# Patient Record
Sex: Female | Born: 1950 | Race: Black or African American | Hispanic: No | Smoking: Former smoker
Health system: Southern US, Community
[De-identification: ages and names within clinical notes are randomized; demographics above are authoritative.]

## PROBLEM LIST (undated history)

## (undated) DIAGNOSIS — I10 Essential (primary) hypertension: Secondary | ICD-10-CM

## (undated) DIAGNOSIS — E213 Hyperparathyroidism, unspecified: Secondary | ICD-10-CM

## (undated) DIAGNOSIS — J45909 Unspecified asthma, uncomplicated: Secondary | ICD-10-CM

## (undated) DIAGNOSIS — K635 Polyp of colon: Secondary | ICD-10-CM

## (undated) DIAGNOSIS — D351 Benign neoplasm of parathyroid gland: Secondary | ICD-10-CM

## (undated) DIAGNOSIS — D369 Benign neoplasm, unspecified site: Secondary | ICD-10-CM

## (undated) HISTORY — DX: Polyp of colon: K63.5

## (undated) HISTORY — DX: Unspecified asthma, uncomplicated: J45.909

## (undated) HISTORY — DX: Hypercalcemia: E83.52

## (undated) HISTORY — PX: OTHER SURGICAL HISTORY: SHX169

## (undated) HISTORY — DX: Benign neoplasm of parathyroid gland: D35.1

## (undated) HISTORY — DX: Hyperparathyroidism, unspecified: E21.3

---

## 2004-02-14 ENCOUNTER — Ambulatory Visit (HOSPITAL_COMMUNITY): Admission: RE | Admit: 2004-02-14 | Discharge: 2004-02-14 | Payer: Self-pay | Admitting: Endocrinology

## 2007-05-19 ENCOUNTER — Ambulatory Visit (HOSPITAL_COMMUNITY): Admission: RE | Admit: 2007-05-19 | Discharge: 2007-05-19 | Payer: Self-pay | Admitting: Family Medicine

## 2009-03-04 ENCOUNTER — Ambulatory Visit (HOSPITAL_COMMUNITY): Admission: RE | Admit: 2009-03-04 | Discharge: 2009-03-04 | Payer: Self-pay | Admitting: Internal Medicine

## 2009-04-10 ENCOUNTER — Ambulatory Visit (HOSPITAL_COMMUNITY): Admission: RE | Admit: 2009-04-10 | Discharge: 2009-04-10 | Payer: Self-pay | Admitting: Internal Medicine

## 2010-03-06 ENCOUNTER — Ambulatory Visit (HOSPITAL_COMMUNITY): Admission: RE | Admit: 2010-03-06 | Discharge: 2010-03-06 | Payer: Self-pay | Admitting: Gastroenterology

## 2010-04-20 HISTORY — PX: COLONOSCOPY: SHX174

## 2011-12-01 ENCOUNTER — Other Ambulatory Visit (HOSPITAL_COMMUNITY): Payer: Self-pay | Admitting: *Deleted

## 2011-12-01 DIAGNOSIS — Z139 Encounter for screening, unspecified: Secondary | ICD-10-CM

## 2011-12-03 ENCOUNTER — Ambulatory Visit (HOSPITAL_COMMUNITY)
Admission: RE | Admit: 2011-12-03 | Discharge: 2011-12-03 | Disposition: A | Discharge status: Home / Self Care | Payer: PRIVATE HEALTH INSURANCE | Source: Ambulatory Visit | Attending: *Deleted | Admitting: *Deleted

## 2011-12-03 DIAGNOSIS — Z139 Encounter for screening, unspecified: Secondary | ICD-10-CM

## 2011-12-03 DIAGNOSIS — R922 Inconclusive mammogram: Secondary | ICD-10-CM | POA: Insufficient documentation

## 2011-12-03 DIAGNOSIS — Z1231 Encounter for screening mammogram for malignant neoplasm of breast: Secondary | ICD-10-CM | POA: Insufficient documentation

## 2011-12-07 ENCOUNTER — Other Ambulatory Visit: Payer: Self-pay | Admitting: *Deleted

## 2011-12-07 DIAGNOSIS — R928 Other abnormal and inconclusive findings on diagnostic imaging of breast: Secondary | ICD-10-CM

## 2011-12-16 ENCOUNTER — Other Ambulatory Visit (HOSPITAL_COMMUNITY): Payer: Self-pay | Admitting: *Deleted

## 2011-12-16 ENCOUNTER — Ambulatory Visit (HOSPITAL_COMMUNITY)
Admission: RE | Admit: 2011-12-16 | Discharge: 2011-12-16 | Disposition: A | Discharge status: Home / Self Care | Payer: PRIVATE HEALTH INSURANCE | Source: Ambulatory Visit | Attending: *Deleted | Admitting: *Deleted

## 2011-12-16 DIAGNOSIS — R928 Other abnormal and inconclusive findings on diagnostic imaging of breast: Secondary | ICD-10-CM

## 2011-12-16 DIAGNOSIS — N63 Unspecified lump in unspecified breast: Secondary | ICD-10-CM | POA: Insufficient documentation

## 2012-02-18 ENCOUNTER — Other Ambulatory Visit (HOSPITAL_COMMUNITY): Payer: Self-pay | Admitting: Nurse Practitioner

## 2012-02-18 DIAGNOSIS — Z09 Encounter for follow-up examination after completed treatment for conditions other than malignant neoplasm: Secondary | ICD-10-CM

## 2012-03-23 ENCOUNTER — Ambulatory Visit (HOSPITAL_COMMUNITY)
Admission: RE | Admit: 2012-03-23 | Discharge: 2012-03-23 | Disposition: A | Discharge status: Home / Self Care | Payer: PRIVATE HEALTH INSURANCE | Source: Ambulatory Visit | Attending: Nurse Practitioner | Admitting: Nurse Practitioner

## 2012-03-23 DIAGNOSIS — Z09 Encounter for follow-up examination after completed treatment for conditions other than malignant neoplasm: Secondary | ICD-10-CM | POA: Insufficient documentation

## 2012-08-24 ENCOUNTER — Other Ambulatory Visit: Payer: Self-pay | Admitting: Family Medicine

## 2012-08-24 DIAGNOSIS — Z09 Encounter for follow-up examination after completed treatment for conditions other than malignant neoplasm: Secondary | ICD-10-CM

## 2012-08-24 DIAGNOSIS — Z139 Encounter for screening, unspecified: Secondary | ICD-10-CM

## 2012-09-28 ENCOUNTER — Telehealth: Payer: Self-pay | Admitting: Family Medicine

## 2012-09-28 ENCOUNTER — Other Ambulatory Visit (HOSPITAL_COMMUNITY): Payer: Self-pay | Admitting: Family Medicine

## 2012-09-28 ENCOUNTER — Ambulatory Visit (HOSPITAL_COMMUNITY)
Admission: RE | Admit: 2012-09-28 | Discharge: 2012-09-28 | Disposition: A | Discharge status: Home / Self Care | Payer: BC Managed Care – PPO | Source: Ambulatory Visit | Attending: Family Medicine | Admitting: Family Medicine

## 2012-09-28 ENCOUNTER — Other Ambulatory Visit: Payer: Self-pay | Admitting: Family Medicine

## 2012-09-28 DIAGNOSIS — N649 Disorder of breast, unspecified: Secondary | ICD-10-CM | POA: Insufficient documentation

## 2012-09-28 DIAGNOSIS — Z09 Encounter for follow-up examination after completed treatment for conditions other than malignant neoplasm: Secondary | ICD-10-CM

## 2012-09-29 NOTE — Telephone Encounter (Signed)
Per Dr Modesto Charon he "maxed out and prefers to be seen by another provider   Left message for her to return call

## 2012-09-29 NOTE — Telephone Encounter (Signed)
fyi

## 2012-09-30 NOTE — Telephone Encounter (Signed)
Pt notified and informed other providers that would be glad to take her as new pt

## 2012-11-03 ENCOUNTER — Other Ambulatory Visit: Payer: Self-pay | Admitting: Family Medicine

## 2012-11-03 DIAGNOSIS — Z09 Encounter for follow-up examination after completed treatment for conditions other than malignant neoplasm: Secondary | ICD-10-CM

## 2012-12-07 ENCOUNTER — Other Ambulatory Visit (HOSPITAL_COMMUNITY): Payer: Self-pay | Admitting: Family Medicine

## 2012-12-07 ENCOUNTER — Ambulatory Visit (HOSPITAL_COMMUNITY)
Admission: RE | Admit: 2012-12-07 | Discharge: 2012-12-07 | Disposition: A | Discharge status: Home / Self Care | Payer: BC Managed Care – PPO | Source: Ambulatory Visit | Attending: Family Medicine | Admitting: Family Medicine

## 2012-12-07 DIAGNOSIS — Z09 Encounter for follow-up examination after completed treatment for conditions other than malignant neoplasm: Secondary | ICD-10-CM

## 2012-12-07 DIAGNOSIS — Z1231 Encounter for screening mammogram for malignant neoplasm of breast: Secondary | ICD-10-CM | POA: Insufficient documentation

## 2012-12-12 ENCOUNTER — Other Ambulatory Visit: Payer: Self-pay | Admitting: Nurse Practitioner

## 2013-01-24 ENCOUNTER — Ambulatory Visit (INDEPENDENT_AMBULATORY_CARE_PROVIDER_SITE_OTHER): Payer: BC Managed Care – PPO | Admitting: Nurse Practitioner

## 2013-01-24 ENCOUNTER — Encounter: Payer: Self-pay | Admitting: Nurse Practitioner

## 2013-01-24 VITALS — BP 146/75 | HR 65 | Temp 98.7°F | Ht 63.0 in | Wt 120.0 lb

## 2013-01-24 DIAGNOSIS — Z01419 Encounter for gynecological examination (general) (routine) without abnormal findings: Secondary | ICD-10-CM

## 2013-01-24 DIAGNOSIS — Z0000 Encounter for general adult medical examination without abnormal findings: Secondary | ICD-10-CM

## 2013-01-24 DIAGNOSIS — Z124 Encounter for screening for malignant neoplasm of cervix: Secondary | ICD-10-CM

## 2013-01-24 LAB — POCT CBC
Granulocyte percent: 49.6 %G (ref 37–80)
HCT, POC: 41.6 % (ref 37.7–47.9)
Hemoglobin: 13.5 g/dL (ref 12.2–16.2)
Lymph, poc: 1.8 (ref 0.6–3.4)
MCH, POC: 29.5 pg (ref 27–31.2)
MCHC: 32.5 g/dL (ref 31.8–35.4)
MCV: 90.8 fL (ref 80–97)
MPV: 7.4 fL (ref 0–99.8)
POC Granulocyte: 2.1 (ref 2–6.9)
POC LYMPH PERCENT: 42.4 %L (ref 10–50)
Platelet Count, POC: 262 10*3/uL (ref 142–424)
RBC: 4.6 M/uL (ref 4.04–5.48)
RDW, POC: 13 %
WBC: 4.3 10*3/uL — AB (ref 4.6–10.2)

## 2013-01-24 LAB — POCT URINALYSIS DIPSTICK
Bilirubin, UA: NEGATIVE
Glucose, UA: NEGATIVE
Ketones, UA: NEGATIVE
Leukocytes, UA: NEGATIVE
Nitrite, UA: NEGATIVE
Spec Grav, UA: 1.02
Urobilinogen, UA: NEGATIVE
pH, UA: 6.5

## 2013-01-24 NOTE — Patient Instructions (Addendum)

## 2013-01-24 NOTE — Progress Notes (Signed)
  Subjective:    Patient ID: Kimberly Aguilar, female    DOB: July 10, 1950, 62 y.o.   MRN: [id number]  HPI Pt here for PE and pap. Pt denies any medical problems, concerns, or complaints at this time.    Review of Systems  All other systems reviewed and are negative.       Objective:   Physical Exam  Vitals reviewed. Constitutional: She is oriented to person, place, and time. She appears well-developed and well-nourished.  HENT:  Head: Normocephalic.  Right Ear: Hearing, tympanic membrane, external ear and ear canal normal.  Left Ear: Hearing, tympanic membrane, external ear and ear canal normal.  Nose: Nose normal.  Mouth/Throat: Uvula is midline and oropharynx is clear and moist.  Eyes: Conjunctivae and EOM are normal. Pupils are equal, round, and reactive to light.  Neck: Normal range of motion and full passive range of motion without pain. Neck supple. No JVD present. Carotid bruit is not present. No mass and no thyromegaly present.  Cardiovascular: Normal rate, regular rhythm, normal heart sounds and intact distal pulses.   No murmur heard. Pulmonary/Chest: Effort normal and breath sounds normal. No respiratory distress. She has no wheezes. Right breast exhibits no inverted nipple, no mass, no nipple discharge, no skin change and no tenderness. Left breast exhibits no inverted nipple, no mass, no nipple discharge, no skin change and no tenderness.  Abdominal: Soft. Bowel sounds are normal. She exhibits no distension and no mass. There is no tenderness.  Genitourinary: Vagina normal and uterus normal. No breast swelling, tenderness, discharge or bleeding.  bimanual exam-No adnexal masses or tenderness. Cervix parous and pink  Musculoskeletal: Normal range of motion.  Lymphadenopathy:    She has no cervical adenopathy.  Neurological: She is alert and oriented to person, place, and time. She has normal reflexes.  Skin: Skin is warm and dry.  Psychiatric: She has a normal mood and  affect. Her behavior is normal. Judgment and thought content normal.     BP 146/75  Pulse 65  Temp(Src) 98.7 F (37.1 C) (Oral)  Ht 5\' 3"  (1.6 m)  Wt 120 lb (54.432 kg)  BMI 21.26 kg/m2       Assessment & Plan:   1. Annual physical exam   2. Encounter for routine gynecological examination    Orders Placed This Encounter  Procedures  . CMP14+EGFR  . NMR, lipoprofile  . Thyroid Panel With TSH  . Vit D  25 hydroxy (rtn osteoporosis monitoring)  . Urinalysis Dipstick  . POCT CBC   Referral made for colonoscopy Continue all meds Labs pending Diet and exercise encouraged Health maintenance reviewed Follow up in 1 year  Mary-Margaret Daphine Deutscher, FNP

## 2013-01-26 LAB — CMP14+EGFR
ALT: 13 IU/L (ref 0–32)
AST: 14 IU/L (ref 0–40)
Albumin/Globulin Ratio: 1.8 (ref 1.1–2.5)
Albumin: 4.6 g/dL (ref 3.6–4.8)
Alkaline Phosphatase: 76 IU/L (ref 39–117)
BUN/Creatinine Ratio: 14 (ref 11–26)
BUN: 13 mg/dL (ref 8–27)
CO2: 27 mmol/L (ref 18–29)
Calcium: 10.6 mg/dL — ABNORMAL HIGH (ref 8.6–10.2)
Chloride: 102 mmol/L (ref 97–108)
Creatinine, Ser: 0.94 mg/dL (ref 0.57–1.00)
GFR calc Af Amer: 76 mL/min/{1.73_m2} (ref >59)
GFR calc non Af Amer: 66 mL/min/{1.73_m2} (ref >59)
Globulin, Total: 2.5 g/dL (ref 1.5–4.5)
Glucose: 92 mg/dL (ref 65–99)
Potassium: 4.4 mmol/L (ref 3.5–5.2)
Sodium: 142 mmol/L (ref 134–144)
Total Bilirubin: 0.4 mg/dL (ref 0.0–1.2)
Total Protein: 7.1 g/dL (ref 6.0–8.5)

## 2013-01-26 LAB — NMR, LIPOPROFILE
Cholesterol: 220 mg/dL — ABNORMAL HIGH (ref <200)
HDL Cholesterol by NMR: 93 mg/dL (ref >=40)
HDL Particle Number: 39.2 umol/L (ref >=30.5)
LDL Particle Number: 917 nmol/L (ref <1000)
LDL Size: 21.2 nm (ref >20.5)
LDLC SERPL CALC-MCNC: 113 mg/dL — ABNORMAL HIGH (ref <100)
LP-IR Score: <25 (ref <=45)
Small LDL Particle Number: <90 nmol/L (ref <=527)
Triglycerides by NMR: 69 mg/dL (ref <150)

## 2013-01-26 LAB — VITAMIN D 25 HYDROXY (VIT D DEFICIENCY, FRACTURES): Vit D, 25-Hydroxy: 38.1 ng/mL (ref 30.0–100.0)

## 2013-01-26 LAB — THYROID PANEL WITH TSH
Free Thyroxine Index: 1.7 (ref 1.2–4.9)
T3 Uptake Ratio: 27 % (ref 24–39)
T4, Total: 6.2 ug/dL (ref 4.5–12.0)
TSH: 4.71 u[IU]/mL — ABNORMAL HIGH (ref 0.450–4.500)

## 2013-01-28 LAB — PAP IG W/ RFLX HPV ASCU: PAP Smear Comment: 0

## 2013-02-09 ENCOUNTER — Encounter: Payer: Self-pay | Admitting: *Deleted

## 2013-02-09 NOTE — Progress Notes (Signed)
Quick Note:  Copy of labs sent to patient ______ 

## 2013-03-06 ENCOUNTER — Encounter: Payer: Self-pay | Admitting: Internal Medicine

## 2013-03-31 ENCOUNTER — Ambulatory Visit (INDEPENDENT_AMBULATORY_CARE_PROVIDER_SITE_OTHER): Payer: BC Managed Care – PPO | Admitting: Family Medicine

## 2013-03-31 ENCOUNTER — Encounter: Payer: Self-pay | Admitting: Family Medicine

## 2013-03-31 DIAGNOSIS — R7989 Other specified abnormal findings of blood chemistry: Secondary | ICD-10-CM | POA: Insufficient documentation

## 2013-03-31 DIAGNOSIS — L219 Seborrheic dermatitis, unspecified: Secondary | ICD-10-CM | POA: Insufficient documentation

## 2013-03-31 DIAGNOSIS — L218 Other seborrheic dermatitis: Secondary | ICD-10-CM

## 2013-03-31 DIAGNOSIS — R6889 Other general symptoms and signs: Secondary | ICD-10-CM

## 2013-03-31 NOTE — Progress Notes (Signed)
Patient ID: Kimberly Aguilar, female   DOB: 05/28/1950, 62 y.o.   MRN: [id number] SUBJECTIVE: CC: Chief Complaint  Patient presents with  . Hypothyroidism    TSH slightly elevated at visit 2 months ago. She was asked to rpt TSH. She was not started on any medications. Patient does complain of fatigue but no other symptoms of hypothyroidism.   Marland Kitchen Hypercalcemia    HPI: Had a physical exam recently. See EPIC note. The TSH was abnormal. Saw Dr Sherril Croon  Many years ago and was found to have hypercalcemia.had work up. Was on a medication in the past then later diet low in calcium. Fatigue and scalp rashes.   Past Medical History  Diagnosis Date  . Hypercalcemia    Past Surgical History  Procedure Laterality Date  . Cyst removal from left breast     History   Social History  . Marital Status: Widowed    Spouse Name: N/A    Number of Children: N/A  . Years of Education: N/A   Occupational History  . Not on file.   Social History Main Topics  . Smoking status: Former Smoker    Quit date: 03/31/1973  . Smokeless tobacco: Not on file  . Alcohol Use: No  . Drug Use: No  . Sexual Activity: Not on file   Other Topics Concern  . Not on file   Social History Narrative  . No narrative on file   Family History  Problem Relation Age of Onset  . Hypertension Mother   . Hypertension Father   . Congestive Heart Failure Brother   . Seizures Brother    No current outpatient prescriptions on file prior to visit.   No current facility-administered medications on file prior to visit.   No Known Allergies  There is no immunization history on file for this patient. Prior to Admission medications   Not on File     ROS: As above in the HPI. All other systems are stable or negative.  OBJECTIVE: APPEARANCE:  Patient in no acute distress.The patient appeared well nourished and normally developed. Acyanotic. Waist: VITAL SIGNS:BP 159/81  Pulse 70  Temp(Src) 99.4 F (37.4 C) (Oral)   Ht 5\' 3"  (1.6 m)  Wt 119 lb (53.978 kg)  BMI 21.09 kg/m2  Recheck BP 142/78  AAF  SKIN: warm and  Dry without , tattoos and scars  Left posterior scalp itchy and flakes.  HEAD and Neck: without JVD, Head and scalp: normal Eyes:No scleral icterus. Fundi normal, eye movements normal. Ears: Auricle normal, canal normal, Tympanic membranes normal, insufflation normal. Nose: normal Throat: normal Neck & thyroid: normal  CHEST & LUNGS: Chest wall: normal Lungs: Clear  CVS: Reveals the PMI to be normally located. Regular rhythm, First and Second Heart sounds are normal,  absence of murmurs, rubs or gallops. Peripheral vasculature: Radial pulses: normal Dorsal pedis pulses: normal Posterior pulses: normal  ABDOMEN:  Appearance: normal Benign, no organomegaly, no masses, no Abdominal Aortic enlargement. No Guarding , no rebound. No Bruits. Bowel sounds: normal  RECTAL: N/A GU: N/A  EXTREMETIES: nonedematous.  MUSCULOSKELETAL:  Spine: normal Joints: intact  NEUROLOGIC: oriented to time,place and person; nonfocal. Strength is normal Sensory is normal Reflexes are normal Cranial Nerves are normal.   Results for orders placed in visit on 01/24/13  CMP14+EGFR      Result Value Range   Glucose 92  65 - 99 mg/dL   BUN 13  8 - 27 mg/dL   Creatinine, Ser 4.09  0.57 - 1.00 mg/dL   GFR calc non Af Amer 66  >59 mL/min/1.73   GFR calc Af Amer 76  >59 mL/min/1.73   BUN/Creatinine Ratio 14  11 - 26   Sodium 142  134 - 144 mmol/L   Potassium 4.4  3.5 - 5.2 mmol/L   Chloride 102  97 - 108 mmol/L   CO2 27  18 - 29 mmol/L   Calcium 10.6 (*) 8.6 - 10.2 mg/dL   Total Protein 7.1  6.0 - 8.5 g/dL   Albumin 4.6  3.6 - 4.8 g/dL   Globulin, Total 2.5  1.5 - 4.5 g/dL   Albumin/Globulin Ratio 1.8  1.1 - 2.5   Total Bilirubin 0.4  0.0 - 1.2 mg/dL   Alkaline Phosphatase 76  39 - 117 IU/L   AST 14  0 - 40 IU/L   ALT 13  0 - 32 IU/L  NMR, LIPOPROFILE      Result Value Range    LDL Particle Number 917  <1000 nmol/L   LDLC SERPL CALC-MCNC 113 (*) <100 mg/dL   HDL Cholesterol by NMR 93  >=40 mg/dL   Triglycerides by NMR 69  <150 mg/dL   Cholesterol 960 (*) <454 mg/dL   HDL Particle Number 09.8  >=11.9 umol/L   Small LDL Particle Number < 90  <= 527 nmol/L   LDL Size 21.2  >20.5 nm   LP-IR Score < 25  <= 45  THYROID PANEL WITH TSH      Result Value Range   TSH 4.710 (*) 0.450 - 4.500 uIU/mL   COMMENT Comment     T4, Total 6.2  4.5 - 12.0 ug/dL   T3 Uptake Ratio 27  24 - 39 %   Free Thyroxine Index 1.7  1.2 - 4.9  VITAMIN D 25 HYDROXY      Result Value Range   Vit D, 25-Hydroxy 38.1  30.0 - 100.0 ng/mL  POCT URINALYSIS DIPSTICK      Result Value Range   Color, UA yellow     Clarity, UA clear     Glucose, UA neg     Bilirubin, UA neg     Ketones, UA neg     Spec Grav, UA 1.020     Blood, UA trace     pH, UA 6.5     Protein, UA small     Urobilinogen, UA negative     Nitrite, UA neg     Leukocytes, UA Negative    POCT CBC      Result Value Range   WBC 4.3 (*) 4.6 - 10.2 K/uL   Lymph, poc 1.8  0.6 - 3.4   POC LYMPH PERCENT 42.4  10 - 50 %L   POC Granulocyte 2.1  2 - 6.9   Granulocyte percent 49.6  37 - 80 %G   RBC 4.6  4.04 - 5.48 M/uL   Hemoglobin 13.5  12.2 - 16.2 g/dL   HCT, POC 14.7  82.9 - 47.9 %   MCV 90.8  80 - 97 fL   MCH, POC 29.5  27 - 31.2 pg   MCHC 32.5  31.8 - 35.4 g/dL   RDW, POC 56.2     Platelet Count, POC 262.0  142 - 424 K/uL   MPV 7.4  0 - 99.8 fL  PAP IG W/ RFLX HPV ASCU      Result Value Range   DIAGNOSIS: Comment     Specimen adequacy: Comment  PATH REPORT.FINAL DX SPEC Comment     Performed by: Comment     QC reviewed by: Comment     PAP SMEAR COMMENT .     Note: Comment     Test Methodology Comment     PAP REFLEX: Comment      ASSESSMENT: Hypercalcemia - Plan: PTH, Intact and Calcium, Calcium, ionized  Abnormal TSH - Plan: Thyroid Panel With TSH  Seborrheic eczema of scalp  PLAN: Discussed the  abnormal labs.  Orders Placed This Encounter  Procedures  . PTH, Intact and Calcium  . Thyroid Panel With TSH  . Calcium, ionized  await results. Reviewed her record in EPIC.  No orders of the defined types were placed in this encounter.   There are no discontinued medications. Return if symptoms worsen or fail to improve.  Kebron Pulse P. Modesto Charon, M.D.

## 2013-04-05 LAB — THYROID PANEL WITH TSH
Free Thyroxine Index: 1.5 (ref 1.2–4.9)
T3 Uptake Ratio: 29 % (ref 24–39)
T4, Total: 5.2 ug/dL (ref 4.5–12.0)
TSH: 3.75 u[IU]/mL (ref 0.450–4.500)

## 2013-04-05 LAB — PTH, INTACT AND CALCIUM
Calcium: 10.9 mg/dL — ABNORMAL HIGH (ref 8.6–10.2)
PTH: 91 pg/mL — ABNORMAL HIGH (ref 15–65)

## 2013-04-05 LAB — SPECIMEN STATUS REPORT

## 2013-04-05 LAB — CALCIUM, IONIZED: Calcium, Ion: 5.7 mg/dL — ABNORMAL HIGH (ref 4.5–5.6)

## 2013-04-06 ENCOUNTER — Other Ambulatory Visit (INDEPENDENT_AMBULATORY_CARE_PROVIDER_SITE_OTHER): Payer: BC Managed Care – PPO | Admitting: Family Medicine

## 2013-04-06 DIAGNOSIS — E213 Hyperparathyroidism, unspecified: Secondary | ICD-10-CM

## 2013-04-06 NOTE — Progress Notes (Signed)
Quick Note:  Call Patient Labs that are abnormal: Calcium and PTH are high. 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 We will need to delineate which part.   The thyroid panel is fine. Does not need thyroid hormones  Recommendations: Sestamibi scan Ordered in Epic   ______

## 2013-04-18 ENCOUNTER — Telehealth: Payer: Self-pay | Admitting: Family Medicine

## 2013-04-18 NOTE — Telephone Encounter (Signed)
Tried calling on labs no numbers working

## 2013-04-18 NOTE — Telephone Encounter (Signed)
Message copied by Azalee Course on Tue Apr 18, 2013  3:01 PM ------      Message from: Ileana Ladd      Created: Thu Apr 06, 2013 10:38 PM       Call Patient      Labs that are abnormal:      Calcium and PTH are high. 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 We will need to delineate which part.             The thyroid panel is fine. Does not need thyroid hormones            Recommendations:      Sestamibi scan      Ordered in Epic             ------

## 2013-04-20 DIAGNOSIS — D351 Benign neoplasm of parathyroid gland: Secondary | ICD-10-CM

## 2013-04-20 HISTORY — DX: Benign neoplasm of parathyroid gland: D35.1

## 2013-05-02 ENCOUNTER — Encounter: Payer: Self-pay | Admitting: Internal Medicine

## 2013-05-02 ENCOUNTER — Ambulatory Visit (AMBULATORY_SURGERY_CENTER): Payer: Self-pay

## 2013-05-02 VITALS — Ht 62.0 in | Wt 119.8 lb

## 2013-05-02 DIAGNOSIS — Z1211 Encounter for screening for malignant neoplasm of colon: Secondary | ICD-10-CM

## 2013-05-02 MED ORDER — MOVIPREP 100 G PO SOLR: ORAL | Status: DC

## 2013-05-08 ENCOUNTER — Encounter: Payer: Self-pay | Admitting: *Deleted

## 2013-05-15 ENCOUNTER — Telehealth: Payer: Self-pay | Admitting: Family Medicine

## 2013-05-15 ENCOUNTER — Telehealth: Payer: Self-pay | Admitting: Internal Medicine

## 2013-05-15 NOTE — Telephone Encounter (Signed)
Reviewed Ca 110.6 and PTH elevated, will need further eval by Dr Jacelyn Grip but there is no acute concern. Lab values will not interfere with colonoscopy prep or with sedation

## 2013-05-15 NOTE — Telephone Encounter (Signed)
PT NOTIFED AND LAB RESULTS GIVEN

## 2013-05-16 NOTE — Telephone Encounter (Signed)
Called pt, no answer, left message for her to call

## 2013-05-17 ENCOUNTER — Encounter: Payer: Self-pay | Admitting: Internal Medicine

## 2013-05-17 ENCOUNTER — Ambulatory Visit (AMBULATORY_SURGERY_CENTER): Payer: BC Managed Care – PPO | Admitting: Internal Medicine

## 2013-05-17 VITALS — BP 135/79 | HR 52 | Temp 98.0°F | Resp 19 | Ht 62.0 in | Wt 119.0 lb

## 2013-05-17 DIAGNOSIS — D126 Benign neoplasm of colon, unspecified: Secondary | ICD-10-CM

## 2013-05-17 DIAGNOSIS — Z1211 Encounter for screening for malignant neoplasm of colon: Secondary | ICD-10-CM

## 2013-05-17 MED ORDER — SODIUM CHLORIDE 0.9 % IV SOLN: 500.0000 mL | INTRAVENOUS | Status: DC

## 2013-05-17 NOTE — Patient Instructions (Signed)
YOU HAD AN ENDOSCOPIC PROCEDURE TODAY AT THE [facility]: Refer to the procedure report that was given to you for any specific questions about what was found during the examination.  If the procedure report does not answer your questions, please call your gastroenterologist to clarify.  If you requested that your care partner not be given the details of your procedure findings, then the procedure report has been included in a sealed envelope for you to review at your convenience later.  YOU SHOULD EXPECT: Some feelings of bloating in the abdomen. Passage of more gas than usual.  Walking can help get rid of the air that was put into your GI tract during the procedure and reduce the bloating. If you had a lower endoscopy (such as a colonoscopy or flexible sigmoidoscopy) you may notice spotting of blood in your stool or on the toilet paper. If you underwent a bowel prep for your procedure, then you may not have a normal bowel movement for a few days.  DIET: Your first meal following the procedure should be a light meal and then it is ok to progress to your normal diet.  A half-sandwich or bowl of soup is an example of a good first meal.  Heavy or fried foods are harder to digest and may make you feel nauseous or bloated.  Likewise meals heavy in dairy and vegetables can cause extra gas to form and this can also increase the bloating.  Drink plenty of fluids but you should avoid alcoholic beverages for 24 hours.  ACTIVITY: Your care partner should take you home directly after the procedure.  You should plan to take it easy, moving slowly for the rest of the day.  You can resume normal activity the day after the procedure however you should NOT DRIVE or use heavy machinery for 24 hours (because of the sedation medicines used during the test).    SYMPTOMS TO REPORT IMMEDIATELY: A gastroenterologist can be reached at any hour.  During normal business hours, 8:30 AM to 5:00 PM Monday through Friday,  call [phone].  After hours and on weekends, please call the GI answering service at [phone] who will take a message and have the physician on call contact you.   Following lower endoscopy (colonoscopy or flexible sigmoidoscopy):  Excessive amounts of blood in the stool  Significant tenderness or worsening of abdominal pains  Swelling of the abdomen that is new, acute  Fever of 100F or higher    FOLLOW UP: If any biopsies were taken you will be contacted by phone or by letter within the next 1-3 weeks.  Call your gastroenterologist if you have not heard about the biopsies in 3 weeks.  Our staff will call the home number listed on your records the next business day following your procedure to check on you and address any questions or concerns that you may have at that time regarding the information given to you following your procedure. This is a courtesy call and so if there is no answer at the home number and we have not heard from you through the emergency physician on call, we will assume that you have returned to your regular daily activities without incident.  SIGNATURES/CONFIDENTIALITY: You and/or your care partner have signed paperwork which will be entered into your electronic medical record.  These signatures attest to the fact that that the information above on your After Visit Summary has been reviewed and is understood.  Full responsibility of the confidentiality   of this discharge information lies with you and/or your care-partner.  Polyp and diverticulosis information given.  Await pathology results.

## 2013-05-17 NOTE — Telephone Encounter (Signed)
LMOM that ok to proceed with procedure

## 2013-05-17 NOTE — Op Note (Signed)
[place]  [address]. [place], [address]   COLONOSCOPY PROCEDURE REPORT  PATIENT: Kimberly Aguilar, Kimberly Aguilar  MR#: [id number] BIRTHDATE: 21-Jun-1950 , 35  yrs. old GENDER: Female ENDOSCOPIST: Lafayette Dragon, MD REFERRED YD:XAJO Rockne Coons, N.P. PROCEDURE DATE:  05/17/2013 PROCEDURE:   Colonoscopy with snare polypectomy First Screening Colonoscopy - Avg.  risk and is 50 yrs.  old or older Yes.  Prior Negative Screening - Now for repeat screening. N/A  History of Adenoma - Now for follow-up colonoscopy & has been > or = to 3 yrs.  N/A  Polyps Removed Today? Yes. ASA CLASS:   Class II INDICATIONS:Average risk patient for colon cancer. MEDICATIONS: MAC sedation, administered by CRNA and Propofol (Diprivan) 480 mg IV  DESCRIPTION OF PROCEDURE:   After the risks benefits and alternatives of the procedure were thoroughly explained, informed consent was obtained.  A digital rectal exam revealed no abnormalities of the rectum.   The LB [id number]  endoscope was introduced through the anus and advanced to the cecum, which was identified by both the appendix and ileocecal valve. No adverse events experienced.   The quality of the prep was good, using MoviPrep  The instrument was then slowly withdrawn as the colon was fully examined.      COLON FINDINGS: A fungating semi-pedunculated  soft polyp measuring 15 mm in size was found in the ascending colon.at 60 cm  A polypectomy was performed piecemeal, using snare cautery.  The resection was complete and the polyp tissue was completely retrieved.  Retroflexed views revealed no abnormalities. The time to cecum=4 minutes 33 seconds.  Withdrawal time=34 minutes 17 seconds.  The scope was withdrawn and the procedure completed. COMPLICATIONS: There were no complications.  ENDOSCOPIC IMPRESSION: Semi-pedunculated polyp measuring 15 mm in size was found in the ascending colon at 60cm ( short scope) polypectomy was  performed using snare cautery , polyp removed piecemeal  RECOMMENDATIONS: 1.  Await pathology results 2.  recall colonoscopy pending path report High fiber diet   eSigned:  Lafayette Dragon, MD 05/17/2013 3:29 PM   cc:   PATIENT NAME:  [name], [name] MR#: [id number]

## 2013-05-17 NOTE — Progress Notes (Signed)
Pt stable to RR 

## 2013-05-17 NOTE — Progress Notes (Signed)
Called to room to assist during endoscopic procedure.  Patient ID and intended procedure confirmed with present staff. Received instructions for my participation in the procedure from the performing physician.  

## 2013-05-18 ENCOUNTER — Telehealth: Payer: Self-pay | Admitting: *Deleted

## 2013-05-18 NOTE — Telephone Encounter (Signed)
  Follow up Call-  Call back number 05/17/2013  Post procedure Call Back phone  # [phone]  Permission to leave phone message Yes     Patient questions:  Do you have a fever, pain , or abdominal swelling? no Pain Score  0 *  Have you tolerated food without any problems? yes  Have you been able to return to your normal activities? yes  Do you have any questions about your discharge instructions: Diet   no Medications  no Follow up visit  no  Do you have questions or concerns about your Care? no  Actions: * If pain score is 4 or above: No action needed, pain <4.

## 2013-05-23 ENCOUNTER — Encounter: Payer: Self-pay | Admitting: Internal Medicine

## 2013-05-24 ENCOUNTER — Encounter: Payer: Self-pay | Admitting: *Deleted

## 2013-06-01 ENCOUNTER — Telehealth: Payer: Self-pay | Admitting: Family Medicine

## 2013-06-03 NOTE — Telephone Encounter (Signed)
Call patient, she needs an appointment to discuss her results face to face .  she did not have a functioning telephone number and a letter was sent. It has been 2 months. She needs a nuclear scan and most likely needs surgery for her abnormal problem. Therefore since she is so difficult to get in touch with her she needs to come in for me to explain to her her problem and get the referrals done while she is here. Thanks. Lory Galan P. Jacelyn Grip, M.D.

## 2013-06-05 NOTE — Telephone Encounter (Signed)
Spoke with pt and appt given for feb 23 with dr Jacelyn Grip

## 2013-06-12 ENCOUNTER — Ambulatory Visit (INDEPENDENT_AMBULATORY_CARE_PROVIDER_SITE_OTHER): Payer: BC Managed Care – PPO | Admitting: Family Medicine

## 2013-06-12 ENCOUNTER — Encounter: Payer: Self-pay | Admitting: Family Medicine

## 2013-06-12 VITALS — BP 151/71 | HR 55 | Temp 98.4°F | Ht 63.0 in | Wt 119.2 lb

## 2013-06-12 DIAGNOSIS — E213 Hyperparathyroidism, unspecified: Secondary | ICD-10-CM

## 2013-06-12 DIAGNOSIS — Z860100 Personal history of colon polyps, unspecified: Secondary | ICD-10-CM | POA: Insufficient documentation

## 2013-06-12 DIAGNOSIS — I10 Essential (primary) hypertension: Secondary | ICD-10-CM

## 2013-06-12 DIAGNOSIS — K635 Polyp of colon: Secondary | ICD-10-CM | POA: Insufficient documentation

## 2013-06-12 MED ORDER — AMLODIPINE BESYLATE 2.5 MG PO TABS: 2.5000 mg | ORAL_TABLET | Freq: Every day | ORAL | Status: DC

## 2013-06-12 NOTE — Progress Notes (Signed)
Patient ID: Kimberly Aguilar, female   DOB: 09-12-1950, 63 y.o.   MRN: [id number] SUBJECTIVE: CC: Chief Complaint  Patient presents with  . Follow-up    2 month follow up  discuss labs     HPI: Here for follow up. Thyroid panel was normal. PTH elevated Calcium elevated.  Had colonoscopy. Wanted to ask about that.  Healthy diet and exercise. Does zumba  BP elevated. Asymptomatic. FH of HTN both parents.    Past Medical History  Diagnosis Date  . Hypercalcemia   . Colon polyp    Past Surgical History  Procedure Laterality Date  . Cyst removal from left breast     History   Social History  . Marital Status: Widowed    Spouse Name: N/A    Number of Children: N/A  . Years of Education: N/A   Occupational History  . Not on file.   Social History Main Topics  . Smoking status: Former Smoker    Types: Cigarettes    Quit date: 03/31/1973  . Smokeless tobacco: Never Used  . Alcohol Use: No  . Drug Use: No  . Sexual Activity: Not on file   Other Topics Concern  . Not on file   Social History Narrative  . No narrative on file   Family History  Problem Relation Age of Onset  . Hypertension Mother   . Hypertension Father   . Congestive Heart Failure Brother   . Heart disease Brother   . Seizures Brother   . Heart disease Brother   . Colon cancer Neg Hx    Current Outpatient Prescriptions on File Prior to Visit  Medication Sig Dispense Refill  . acetaminophen (TYLENOL) 500 MG tablet Take 500 mg by mouth as needed.      . Flaxseed, Linseed, (BL FLAX SEED OIL PO) Take by mouth. Take 1 tsp daily       No current facility-administered medications on file prior to visit.   No Known Allergies  There is no immunization history on file for this patient. Prior to Admission medications   Medication Sig Start Date End Date Taking? Authorizing Provider  acetaminophen (TYLENOL) 500 MG tablet Take 500 mg by mouth as needed.   Yes Historical Provider, MD  Flaxseed,  Linseed, (BL FLAX SEED OIL PO) Take by mouth. Take 1 tsp daily   Yes Historical Provider, MD     ROS: As above in the HPI. All other systems are stable or negative.  OBJECTIVE: APPEARANCE:  Patient in no acute distress.The patient appeared well nourished and normally developed. Acyanotic. Waist: VITAL SIGNS:BP 151/71  Pulse 55  Temp(Src) 98.4 F (36.9 C) (Oral)  Ht 5\' 3"  (1.6 m)  Wt 119 lb 3.2 oz (54.069 kg)  BMI 21.12 kg/m2  Slim AAF  SKIN: warm and  Dry without overt rashes, tattoos and scars  HEAD and Neck: without JVD, Head and scalp: normal Eyes:No scleral icterus. Fundi normal, eye movements normal. Ears: Auricle normal, canal normal, Tympanic membranes normal, insufflation normal. Nose: normal Throat: normal Neck & thyroid: normal  CHEST & LUNGS: Chest wall: normal Lungs: Clear  CVS: Reveals the PMI to be normally located. Regular rhythm, First and Second Heart sounds are normal,  absence of murmurs, rubs or gallops. Peripheral vasculature: Radial pulses: normal Dorsal pedis pulses: normal Posterior pulses: normal  ABDOMEN:  Appearance: normal Benign, no organomegaly, no masses, no Abdominal Aortic enlargement. No Guarding , no rebound. No Bruits. Bowel sounds: normal  RECTAL: N/A GU: N/A  EXTREMETIES: nonedematous.  MUSCULOSKELETAL:  Spine: normal Joints: intact  NEUROLOGIC: oriented to time,place and person; nonfocal. Strength is normal Sensory is normal Reflexes are normal Cranial Nerves are normal.  Results for orders placed in visit on 03/31/13  PTH, INTACT AND CALCIUM      Result Value Ref Range   Calcium 10.9 (*) 8.6 - 10.2 mg/dL   PTH 91 (*) 15 - 65 pg/mL   PTH Comment    THYROID PANEL WITH TSH      Result Value Ref Range   TSH 3.750  0.450 - 4.500 uIU/mL   T4, Total 5.2  4.5 - 12.0 ug/dL   T3 Uptake Ratio 29  24 - 39 %   Free Thyroxine Index 1.5  1.2 - 4.9  CALCIUM, IONIZED      Result Value Ref Range   Calcium, Ion 5.7  (*) 4.5 - 5.6 mg/dL  SPECIMEN STATUS REPORT      Result Value Ref Range   specimen status report Comment      ASSESSMENT: Personal history of colonic polyps  Hypercalcemia - Plan: NM Parathyroid W/Spect  HTN (hypertension) - Plan: amLODipine (NORVASC) 2.5 MG tablet  Hyperparathyroidism - Plan: NM Parathyroid W/Spect  PLAN:  Orders Placed This Encounter  Procedures  . 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    Order Specific Question:  Preferred imaging location?    Answer:  [facility]   Meds ordered this encounter  Medications  . amLODipine (NORVASC) 2.5 MG tablet    Sig: Take 1 tablet (2.5 mg total) by mouth daily.    Dispense:  30 tablet    Refill:  3  discussed her colonoscopy result with the tubulous adenoma polyp. Agree with the planned repeat in 3 years.  There are no discontinued medications. Return in about 4 weeks (around 07/10/2013) for recheck BP.  Lithzy Bernard P. Jacelyn Grip, M.D.

## 2013-06-23 ENCOUNTER — Encounter (HOSPITAL_COMMUNITY): Payer: BC Managed Care – PPO

## 2013-06-30 ENCOUNTER — Encounter (HOSPITAL_COMMUNITY)
Admission: RE | Admit: 2013-06-30 | Discharge: 2013-06-30 | Disposition: A | Discharge status: Home / Self Care | Payer: BC Managed Care – PPO | Source: Ambulatory Visit | Attending: Family Medicine | Admitting: Family Medicine

## 2013-06-30 ENCOUNTER — Other Ambulatory Visit: Payer: Self-pay | Admitting: Family Medicine

## 2013-06-30 ENCOUNTER — Encounter (HOSPITAL_COMMUNITY): Payer: Self-pay

## 2013-06-30 DIAGNOSIS — E213 Hyperparathyroidism, unspecified: Secondary | ICD-10-CM

## 2013-06-30 DIAGNOSIS — R947 Abnormal results of other endocrine function studies: Secondary | ICD-10-CM | POA: Insufficient documentation

## 2013-06-30 HISTORY — DX: Hyperparathyroidism, unspecified: E21.3

## 2013-06-30 HISTORY — DX: Essential (primary) hypertension: I10

## 2013-06-30 MED ORDER — TECHNETIUM TC 99M SESTAMIBI GENERIC - CARDIOLITE
25.0000 | Freq: Once | INTRAVENOUS | Status: AC | PRN
  Administered 2013-06-30: 25 via INTRAVENOUS

## 2013-07-10 ENCOUNTER — Ambulatory Visit (INDEPENDENT_AMBULATORY_CARE_PROVIDER_SITE_OTHER): Payer: BC Managed Care – PPO | Admitting: Family Medicine

## 2013-07-10 ENCOUNTER — Encounter: Payer: Self-pay | Admitting: Family Medicine

## 2013-07-10 VITALS — BP 132/70 | HR 64 | Temp 98.7°F | Ht 63.0 in | Wt 119.4 lb

## 2013-07-10 DIAGNOSIS — E213 Hyperparathyroidism, unspecified: Secondary | ICD-10-CM

## 2013-07-10 DIAGNOSIS — R946 Abnormal results of thyroid function studies: Secondary | ICD-10-CM

## 2013-07-10 DIAGNOSIS — L218 Other seborrheic dermatitis: Secondary | ICD-10-CM

## 2013-07-10 DIAGNOSIS — R7989 Other specified abnormal findings of blood chemistry: Secondary | ICD-10-CM

## 2013-07-10 DIAGNOSIS — L219 Seborrheic dermatitis, unspecified: Secondary | ICD-10-CM

## 2013-07-10 DIAGNOSIS — D351 Benign neoplasm of parathyroid gland: Secondary | ICD-10-CM | POA: Insufficient documentation

## 2013-07-10 DIAGNOSIS — I10 Essential (primary) hypertension: Secondary | ICD-10-CM | POA: Insufficient documentation

## 2013-07-10 DIAGNOSIS — Z13220 Encounter for screening for lipoid disorders: Secondary | ICD-10-CM

## 2013-07-10 DIAGNOSIS — Z860100 Personal history of colon polyps, unspecified: Secondary | ICD-10-CM

## 2013-07-10 NOTE — Progress Notes (Signed)
Patient ID: Kimberly Aguilar, female   DOB: April 04, 1951, 63 y.o.   MRN: [id number] SUBJECTIVE: CC: Chief Complaint  Patient presents with  . Follow-up    1 month follow up BP    HPI: Patient is here for follow up of hypertension: denies Headache;deniesChest Pain;denies weakness;denies Shortness of Breath or Orthopnea;denies Visual changes;denies palpitations;denies cough;denies pedal edema;denies symptoms of TIA or stroke; admits to Compliance with medications. denies Problems with medications.   Past Medical History  Diagnosis Date  . Hypercalcemia   . Colon polyp   . Parathyroid adenoma 2015  . Hypertension    Past Surgical History  Procedure Laterality Date  . Cyst removal from left breast     History   Social History  . Marital Status: Widowed    Spouse Name: N/A    Number of Children: N/A  . Years of Education: N/A   Occupational History  . Not on file.   Social History Main Topics  . Smoking status: Former Smoker    Types: Cigarettes    Quit date: 03/31/1973  . Smokeless tobacco: Never Used  . Alcohol Use: No  . Drug Use: No  . Sexual Activity: Not on file   Other Topics Concern  . Not on file   Social History Narrative  . No narrative on file   Family History  Problem Relation Age of Onset  . Hypertension Mother   . Hypertension Father   . Congestive Heart Failure Brother   . Heart disease Brother   . Seizures Brother   . Heart disease Brother   . Colon cancer Neg Hx    Current Outpatient Prescriptions on File Prior to Visit  Medication Sig Dispense Refill  . acetaminophen (TYLENOL) 500 MG tablet Take 500 mg by mouth as needed.      Marland Kitchen amLODipine (NORVASC) 2.5 MG tablet Take 1 tablet (2.5 mg total) by mouth daily.  30 tablet  3  . Flaxseed, Linseed, (BL FLAX SEED OIL PO) Take by mouth. Take 1 tsp daily       No current facility-administered medications on file prior to visit.   No Known Allergies  There is no immunization history on file for  this patient. Prior to Admission medications   Medication Sig Start Date End Date Taking? Authorizing Provider  acetaminophen (TYLENOL) 500 MG tablet Take 500 mg by mouth as needed.   Yes Historical Provider, MD  amLODipine (NORVASC) 2.5 MG tablet Take 1 tablet (2.5 mg total) by mouth daily. 06/12/13  Yes Vernie Shanks, MD  Flaxseed, Linseed, (BL FLAX SEED OIL PO) Take by mouth. Take 1 tsp daily   Yes Historical Provider, MD     ROS: As above in the HPI. All other systems are stable or negative.  OBJECTIVE: APPEARANCE:  Patient in no acute distress.The patient appeared well nourished and normally developed. Acyanotic. Waist: VITAL SIGNS:BP 132/70  Pulse 64  Temp(Src) 98.7 F (37.1 C) (Oral)  Ht _0  (1.6 m)  Wt 119 lb 6.4 oz (54.159 kg)  BMI 21.16 kg/m2 AAF  SKIN: warm and  Dry without overt rashes, tattoos and scars  HEAD and Neck: without JVD, Head and scalp: normal Eyes:No scleral icterus. Fundi normal, eye movements normal. Ears: Auricle normal, canal normal, Tympanic membranes normal, insufflation normal. Nose: normal Throat: normal Neck & thyroid: normal  CHEST & LUNGS: Chest wall: normal Lungs: Clear  CVS: Reveals the PMI to be normally located. Regular rhythm, First and Second Heart sounds are normal,  absence of murmurs, rubs or gallops. Peripheral vasculature: Radial pulses: normal Dorsal pedis pulses: normal Posterior pulses: normal  ABDOMEN:  Appearance: normal Benign, no organomegaly, no masses, no Abdominal Aortic enlargement. No Guarding , no rebound. No Bruits. Bowel sounds: normal  RECTAL: N/A GU: N/A  EXTREMETIES: nonedematous.  MUSCULOSKELETAL:  Spine: normal Joints: intact  NEUROLOGIC: oriented to time,place and person; nonfocal. Strength is normal Sensory is normal Reflexes are normal Cranial Nerves are normal.  ASSESSMENT:  Hyperparathyroidism  Abnormal TSH - Plan: CANCELED: Thyroid Panel With  TSH  Hypercalcemia  Seborrheic eczema of scalp  Personal history of colonic polyps  Parathyroid adenoma  Hypertension - Plan: CANCELED: CMP14+EGFR  Screening cholesterol level - Plan: CANCELED: Lipid panel  PLAN: Dash Diet discussed and handout in the AVS  Discussed the hypercalcemia and the finding of a positive sesatmibi scan c/w a parathyroid adenoma. Wished her well on her visit with the surgeon  BP is better and therefore no changes with the adjustment to her BP medications.  No orders of the defined types were placed in this encounter.   No orders of the defined types were placed in this encounter.   There are no discontinued medications. Return in about 4 months (around 11/09/2013) for Recheck medical problems.  Shealeigh Dunstan P. Jacelyn Grip, M.D.

## 2013-07-10 NOTE — Patient Instructions (Signed)
DASH Diet  The DASH diet stands for "Dietary Approaches to Stop Hypertension." It is a healthy eating plan that has been shown to reduce high blood pressure (hypertension) in as little as 14 days, while also possibly providing other significant health benefits. These other health benefits include reducing the risk of breast cancer after menopause and reducing the risk of type 2 diabetes, heart disease, colon cancer, and stroke. Health benefits also include weight loss and slowing kidney failure in patients with chronic kidney disease.   DIET GUIDELINES  · Limit salt (sodium). Your diet should contain less than 1500 mg of sodium daily.  · Limit refined or processed carbohydrates. Your diet should include mostly whole grains. Desserts and added sugars should be used sparingly.  · Include small amounts of heart-healthy fats. These types of fats include nuts, oils, and tub margarine. Limit saturated and trans fats. These fats have been shown to be harmful in the body.  CHOOSING FOODS   The following food groups are based on a 2000 calorie diet. See your Registered Dietitian for individual calorie needs.  Grains and Grain Products (6 to 8 servings daily)  · Eat More Often: Whole-wheat bread, brown rice, whole-grain or wheat pasta, quinoa, popcorn without added fat or salt (air popped).  · Eat Less Often: White bread, white pasta, white rice, cornbread.  Vegetables (4 to 5 servings daily)  · Eat More Often: Fresh, frozen, and canned vegetables. Vegetables may be raw, steamed, roasted, or grilled with a minimal amount of fat.  · Eat Less Often/Avoid: Creamed or fried vegetables. Vegetables in a cheese sauce.  Fruit (4 to 5 servings daily)  · Eat More Often: All fresh, canned (in natural juice), or frozen fruits. Dried fruits without added sugar. One hundred percent fruit juice (½ cup [237 mL] daily).  · Eat Less Often: Dried fruits with added sugar. Canned fruit in light or heavy syrup.  Lean Meats, Fish, and Poultry (2  servings or less daily. One serving is 3 to 4 oz [85-114 g]).  · Eat More Often: Ninety percent or leaner ground beef, tenderloin, sirloin. Round cuts of beef, chicken breast, turkey breast. All fish. Grill, bake, or broil your meat. Nothing should be fried.  · Eat Less Often/Avoid: Fatty cuts of meat, turkey, or chicken leg, thigh, or wing. Fried cuts of meat or fish.  Dairy (2 to 3 servings)  · Eat More Often: Low-fat or fat-free milk, low-fat plain or light yogurt, reduced-fat or part-skim cheese.  · Eat Less Often/Avoid: Milk (whole, 2%). Whole milk yogurt. Full-fat cheeses.  Nuts, Seeds, and Legumes (4 to 5 servings per week)  · Eat More Often: All without added salt.  · Eat Less Often/Avoid: Salted nuts and seeds, canned beans with added salt.  Fats and Sweets (limited)  · Eat More Often: Vegetable oils, tub margarines without trans fats, sugar-free gelatin. Mayonnaise and salad dressings.  · Eat Less Often/Avoid: Coconut oils, palm oils, butter, stick margarine, cream, half and half, cookies, candy, pie.  FOR MORE INFORMATION  The Dash Diet Eating Plan: www.dashdiet.org  Document Released: 03/26/2011 Document Revised: 06/29/2011 Document Reviewed: 03/26/2011  ExitCare® Patient Information ©2014 [employer].

## 2013-07-13 ENCOUNTER — Ambulatory Visit (INDEPENDENT_AMBULATORY_CARE_PROVIDER_SITE_OTHER): Payer: BC Managed Care – PPO | Admitting: General Surgery

## 2013-07-13 ENCOUNTER — Encounter (INDEPENDENT_AMBULATORY_CARE_PROVIDER_SITE_OTHER): Payer: Self-pay | Admitting: General Surgery

## 2013-07-13 VITALS — BP 118/70 | HR 74 | Temp 98.1°F | Ht 62.0 in | Wt 119.0 lb

## 2013-07-13 DIAGNOSIS — E210 Primary hyperparathyroidism: Secondary | ICD-10-CM

## 2013-07-13 NOTE — Progress Notes (Signed)
Patient ID: Kimberly Aguilar, female   DOB: 1950-05-02, 63 y.o.   MRN: [id number]  Chief Complaint  Patient presents with  . eval thyroid    HPI Kimberly Aguilar is a 63 y.o. female.  The patient is a 63 year old female who is referred by Dr. Jacelyn Grip for evaluation of primary hyperparathyroidism. The patient's at approximately 6 year history of elevated calciums. Patient recently underwent sestamibi scan which reveals a right upper parathyroid adenoma. Patient also has noted some previous bony aches, she's had no history of recurrent kidney stones. She's had no signs or symptoms of mood swings, or digested issues.  HPI  Past Medical History  Diagnosis Date  . Hypercalcemia   . Colon polyp   . Parathyroid adenoma 2015  . Hypertension     Past Surgical History  Procedure Laterality Date  . Cyst removal from left breast      Family History  Problem Relation Age of Onset  . Hypertension Mother   . Hypertension Father   . Congestive Heart Failure Brother   . Heart disease Brother   . Seizures Brother   . Heart disease Brother   . Colon cancer Neg Hx     Social History History  Substance Use Topics  . Smoking status: Former Smoker    Types: Cigarettes    Quit date: 03/31/1973  . Smokeless tobacco: Never Used  . Alcohol Use: No    No Known Allergies  Current Outpatient Prescriptions  Medication Sig Dispense Refill  . acetaminophen (TYLENOL) 500 MG tablet Take 500 mg by mouth as needed.      Marland Kitchen amLODipine (NORVASC) 2.5 MG tablet Take 1 tablet (2.5 mg total) by mouth daily.  30 tablet  3  . Flaxseed, Linseed, (BL FLAX SEED OIL PO) Take by mouth. Take 1 tsp daily       No current facility-administered medications for this visit.    Review of Systems Review of Systems  Constitutional: Negative.   HENT: Negative.   Respiratory: Negative.   Cardiovascular: Negative.   Gastrointestinal: Negative.   Neurological: Negative.   All other systems reviewed and are negative.    Blood  pressure 118/70, pulse 74, temperature 98.1 F (36.7 C), temperature source Oral, height 5\' 2"  (1.575 m), weight 119 lb (53.978 kg).  Physical Exam Physical Exam  Constitutional: She is oriented to person, place, and time. She appears well-developed and well-nourished.  HENT:  Head: Normocephalic and atraumatic.  Eyes: Conjunctivae and EOM are normal. Pupils are equal, round, and reactive to light.  Neck: Normal range of motion. Neck supple. No tracheal deviation present. No thyromegaly present.  Cardiovascular: Normal rate, regular rhythm and normal heart sounds.   Pulmonary/Chest: Effort normal and breath sounds normal.  Abdominal: Soft. Bowel sounds are normal. She exhibits no distension and no mass. There is no tenderness. There is no rebound and no guarding.  Musculoskeletal: Normal range of motion.  Neurological: She is alert and oriented to person, place, and time.  Skin: Skin is warm and dry.  Psychiatric: She has a normal mood and affect.    Data Reviewed Elevated calcium  Sestamibi scan reveals a right upper parathyroid adenoma  Assessment    63 year old female with a right upper parathyroid adenoma     Plan    1 we will proceed to the operating room for excision of the right upper parathyroid adenoma 2. I discussed with her the risks and benefits of the procedure to include but not limited to: Infection, bleeding,  damage to structures, possibly further surgery, and possible need for extended stay. The patient was understanding and wishes to proceed.        Rosario Jacks., Kaylor Simenson 07/13/2013, 9:54 AM

## 2013-07-21 ENCOUNTER — Encounter (HOSPITAL_COMMUNITY): Payer: Self-pay | Admitting: Pharmacy Technician

## 2013-07-27 ENCOUNTER — Other Ambulatory Visit (INDEPENDENT_AMBULATORY_CARE_PROVIDER_SITE_OTHER): Payer: BC Managed Care – PPO

## 2013-07-27 DIAGNOSIS — E039 Hypothyroidism, unspecified: Secondary | ICD-10-CM

## 2013-07-27 DIAGNOSIS — I10 Essential (primary) hypertension: Secondary | ICD-10-CM

## 2013-07-27 NOTE — Progress Notes (Signed)
Pt came in for labs only 

## 2013-07-28 LAB — CMP14+EGFR
ALT: 8 IU/L (ref 0–32)
AST: 16 IU/L (ref 0–40)
Albumin/Globulin Ratio: 1.4 (ref 1.1–2.5)
Albumin: 4.2 g/dL (ref 3.6–4.8)
Alkaline Phosphatase: 61 IU/L (ref 39–117)
BUN/Creatinine Ratio: 13 (ref 11–26)
BUN: 14 mg/dL (ref 8–27)
CO2: 24 mmol/L (ref 18–29)
Calcium: 10.5 mg/dL — ABNORMAL HIGH (ref 8.7–10.3)
Chloride: 103 mmol/L (ref 97–108)
Creatinine, Ser: 1.06 mg/dL — ABNORMAL HIGH (ref 0.57–1.00)
GFR calc Af Amer: 65 mL/min/{1.73_m2} (ref >59)
GFR calc non Af Amer: 56 mL/min/{1.73_m2} — ABNORMAL LOW (ref >59)
Globulin, Total: 2.9 g/dL (ref 1.5–4.5)
Glucose: 83 mg/dL (ref 65–99)
Potassium: 4.3 mmol/L (ref 3.5–5.2)
Sodium: 142 mmol/L (ref 134–144)
Total Bilirubin: 0.5 mg/dL (ref 0.0–1.2)
Total Protein: 7.1 g/dL (ref 6.0–8.5)

## 2013-07-28 LAB — THYROID PANEL WITH TSH
Free Thyroxine Index: 1.9 (ref 1.2–4.9)
T3 Uptake Ratio: 30 % (ref 24–39)
T4, Total: 6.3 ug/dL (ref 4.5–12.0)
TSH: 3.63 u[IU]/mL (ref 0.450–4.500)

## 2013-07-28 LAB — LIPID PANEL
Chol/HDL Ratio: 2.4 ratio units (ref 0.0–4.4)
Cholesterol, Total: 200 mg/dL — ABNORMAL HIGH (ref 100–199)
HDL: 82 mg/dL (ref >39)
LDL Calculated: 107 mg/dL — ABNORMAL HIGH (ref 0–99)
Triglycerides: 54 mg/dL (ref 0–149)
VLDL Cholesterol Cal: 11 mg/dL (ref 5–40)

## 2013-07-30 ENCOUNTER — Encounter: Payer: Self-pay | Admitting: Family Medicine

## 2013-07-31 ENCOUNTER — Encounter (HOSPITAL_COMMUNITY)
Admission: RE | Admit: 2013-07-31 | Discharge: 2013-07-31 | Disposition: A | Discharge status: Home / Self Care | Payer: BC Managed Care – PPO | Source: Ambulatory Visit | Attending: General Surgery | Admitting: General Surgery

## 2013-07-31 ENCOUNTER — Encounter (HOSPITAL_COMMUNITY): Payer: Self-pay

## 2013-07-31 DIAGNOSIS — Z01810 Encounter for preprocedural cardiovascular examination: Secondary | ICD-10-CM | POA: Insufficient documentation

## 2013-07-31 DIAGNOSIS — E210 Primary hyperparathyroidism: Secondary | ICD-10-CM | POA: Insufficient documentation

## 2013-07-31 DIAGNOSIS — I498 Other specified cardiac arrhythmias: Secondary | ICD-10-CM | POA: Insufficient documentation

## 2013-07-31 DIAGNOSIS — Z01812 Encounter for preprocedural laboratory examination: Secondary | ICD-10-CM | POA: Insufficient documentation

## 2013-07-31 HISTORY — DX: Benign neoplasm, unspecified site: D36.9

## 2013-07-31 LAB — BASIC METABOLIC PANEL
BUN: 14 mg/dL (ref 6–23)
CHLORIDE: 105 meq/L (ref 96–112)
CO2: 27 meq/L (ref 19–32)
CREATININE: 0.91 mg/dL (ref 0.50–1.10)
Calcium: 10.4 mg/dL (ref 8.4–10.5)
GFR calc Af Amer: 77 mL/min — ABNORMAL LOW (ref >90)
GFR calc non Af Amer: 66 mL/min — ABNORMAL LOW (ref >90)
Glucose, Bld: 89 mg/dL (ref 70–99)
Potassium: 4.3 mEq/L (ref 3.7–5.3)
Sodium: 141 mEq/L (ref 137–147)

## 2013-07-31 LAB — CBC
HEMATOCRIT: 39.5 % (ref 36.0–46.0)
HEMOGLOBIN: 13.2 g/dL (ref 12.0–15.0)
MCH: 30.8 pg (ref 26.0–34.0)
MCHC: 33.4 g/dL (ref 30.0–36.0)
MCV: 92.1 fL (ref 78.0–100.0)
Platelets: 252 10*3/uL (ref 150–400)
RBC: 4.29 MIL/uL (ref 3.87–5.11)
RDW: 12.8 % (ref 11.5–15.5)
WBC: 4.6 10*3/uL (ref 4.0–10.5)

## 2013-07-31 NOTE — Patient Instructions (Signed)
Kimberly Aguilar  07/31/2013                           YOUR PROCEDURE IS SCHEDULED ON: 08/07/13               PLEASE REPORT TO SHORT STAY CENTER AT : 8:30 am               CALL THIS NUMBER IF ANY PROBLEMS THE DAY OF SURGERY :               [phone]                                REMEMBER:   Do not eat food or drink liquids AFTER MIDNIGHT                 Take these medicines the morning of surgery with A SIP OF WATER: NORVASC   Do not wear jewelry, make-up   Do not wear lotions, powders, or perfumes.   Do not shave legs or underarms 12 hrs. before surgery (men may shave face)  Do not bring valuables to the hospital.  Contacts, dentures or bridgework may not be worn into surgery.  Leave suitcase in the car. After surgery it may be brought to your room.  For patients admitted to the hospital more than one night, checkout time is            11:00 AM                                                       The day of discharge.   Patients discharged the day of surgery will not be allowed to drive home.            If going home same day of surgery, must have someone stay with you              FIRST 24 hrs at home and arrange for some one to drive you              home from hospital.                                                            [facility] - Preparing for Surgery Before surgery, you can play an important role.  Because skin is not sterile, your skin needs to be as free of germs as possible.  You can reduce the number of germs on your skin by washing with CHG (chlorahexidine gluconate) soap before surgery.  CHG is an antiseptic cleaner which kills germs and bonds with the skin to continue killing germs even after washing. Please DO NOT use if you have an allergy to CHG or antibacterial soaps.  If your skin becomes reddened/irritated stop using the CHG and inform your nurse when you arrive at Short Stay. Do not shave (including legs and underarms) for at least 48  hours prior to the first CHG shower.  You may shave your face. Please follow these instructions carefully:  1.  Shower with CHG Soap the night before surgery and the  morning of Surgery.   2.  If you choose to wash your hair, wash your hair first as usual with your  normal  Shampoo.   3.  After you shampoo, rinse your hair and body thoroughly to remove the  shampoo.                                         4.  Use CHG as you would any other liquid soap.  You can apply chg directly  to the skin and wash . Gently wash with scrungie or clean wascloth    5.  Apply the CHG Soap to your body ONLY FROM THE NECK DOWN.   Do not use on open                           Wound or open sores. Avoid contact with eyes, ears mouth and genitals (private parts).                        Genitals (private parts) with your normal soap.              6.  Wash thoroughly, paying special attention to the area where your surgery  will be performed.   7.  Thoroughly rinse your body with warm water from the neck down.   8.  DO NOT shower/wash with your normal soap after using and rinsing off  the CHG Soap .                9.  Pat yourself dry with a clean towel.             10.  Wear clean pajamas.             11.  Place clean sheets on your bed the night of your first shower and do not  sleep with pets.  Day of Surgery : Do not apply any lotions/deodorants the morning of surgery.  Please wear clean clothes to the hospital/surgery center.  FAILURE TO FOLLOW THESE INSTRUCTIONS MAY RESULT IN THE CANCELLATION OF YOUR SURGERY    PATIENT SIGNATURE_________________________________

## 2013-08-07 ENCOUNTER — Observation Stay (HOSPITAL_COMMUNITY)
Admission: RE | Admit: 2013-08-07 | Discharge: 2013-08-08 | Disposition: A | Discharge status: Home / Self Care | Payer: BC Managed Care – PPO | Source: Ambulatory Visit | Attending: General Surgery | Admitting: General Surgery

## 2013-08-07 ENCOUNTER — Encounter (HOSPITAL_COMMUNITY): Payer: BC Managed Care – PPO | Admitting: Certified Registered Nurse Anesthetist

## 2013-08-07 ENCOUNTER — Encounter (HOSPITAL_COMMUNITY): Payer: Self-pay | Admitting: Certified Registered Nurse Anesthetist

## 2013-08-07 ENCOUNTER — Ambulatory Visit (HOSPITAL_COMMUNITY): Payer: BC Managed Care – PPO | Admitting: Certified Registered Nurse Anesthetist

## 2013-08-07 ENCOUNTER — Encounter (HOSPITAL_COMMUNITY)
Admission: RE | Disposition: A | Discharge status: Home / Self Care | Payer: Self-pay | Source: Ambulatory Visit | Attending: General Surgery

## 2013-08-07 DIAGNOSIS — Z79899 Other long term (current) drug therapy: Secondary | ICD-10-CM | POA: Insufficient documentation

## 2013-08-07 DIAGNOSIS — I10 Essential (primary) hypertension: Secondary | ICD-10-CM | POA: Insufficient documentation

## 2013-08-07 DIAGNOSIS — D351 Benign neoplasm of parathyroid gland: Secondary | ICD-10-CM

## 2013-08-07 DIAGNOSIS — E892 Postprocedural hypoparathyroidism: Secondary | ICD-10-CM

## 2013-08-07 DIAGNOSIS — Z9889 Other specified postprocedural states: Secondary | ICD-10-CM

## 2013-08-07 DIAGNOSIS — Z87891 Personal history of nicotine dependence: Secondary | ICD-10-CM | POA: Insufficient documentation

## 2013-08-07 DIAGNOSIS — E210 Primary hyperparathyroidism: Secondary | ICD-10-CM | POA: Insufficient documentation

## 2013-08-07 HISTORY — PX: PARATHYROIDECTOMY: SHX19

## 2013-08-07 LAB — CALCIUM
CALCIUM: 9.9 mg/dL (ref 8.4–10.5)
CALCIUM: 9 mg/dL (ref 8.4–10.5)

## 2013-08-07 SURGERY — PARATHYROIDECTOMY
Anesthesia: General | Site: Neck | Laterality: Right

## 2013-08-07 MED ORDER — DEXTROSE-NACL 5-0.9 % IV SOLN
INTRAVENOUS | Status: DC
  Administered 2013-08-07: 19:00:00 via INTRAVENOUS

## 2013-08-07 MED ORDER — NEOSTIGMINE METHYLSULFATE 1 MG/ML IJ SOLN
INTRAMUSCULAR | Status: AC
  Filled 2013-08-07: qty 10

## 2013-08-07 MED ORDER — GLYCOPYRROLATE 0.2 MG/ML IJ SOLN
INTRAMUSCULAR | Status: DC | PRN
  Administered 2013-08-07: 0.4 mg via INTRAVENOUS

## 2013-08-07 MED ORDER — PROPOFOL 10 MG/ML IV BOLUS
INTRAVENOUS | Status: AC
  Filled 2013-08-07: qty 20

## 2013-08-07 MED ORDER — MAGNESIUM GLUCONATE 500 MG PO TABS
500.0000 mg | ORAL_TABLET | Freq: Every day | ORAL | Status: DC
  Administered 2013-08-07 – 2013-08-08 (×2): 500 mg via ORAL
  Filled 2013-08-07 (×2): qty 1

## 2013-08-07 MED ORDER — FENTANYL CITRATE 0.05 MG/ML IJ SOLN
25.0000 ug | INTRAMUSCULAR | Status: DC | PRN
  Administered 2013-08-07 (×2): 25 ug via INTRAVENOUS

## 2013-08-07 MED ORDER — AMLODIPINE BESYLATE 2.5 MG PO TABS
2.5000 mg | ORAL_TABLET | Freq: Every morning | ORAL | Status: DC
  Administered 2013-08-08: 2.5 mg via ORAL
  Filled 2013-08-07: qty 1

## 2013-08-07 MED ORDER — GLYCOPYRROLATE 0.2 MG/ML IJ SOLN
INTRAMUSCULAR | Status: AC
  Filled 2013-08-07: qty 1

## 2013-08-07 MED ORDER — FENTANYL CITRATE 0.05 MG/ML IJ SOLN
INTRAMUSCULAR | Status: AC
  Filled 2013-08-07: qty 2

## 2013-08-07 MED ORDER — CALCIUM CARBONATE-VITAMIN D 500-200 MG-UNIT PO TABS
2.0000 | ORAL_TABLET | Freq: Three times a day (TID) | ORAL | Status: DC
  Administered 2013-08-07 – 2013-08-08 (×3): 2 via ORAL
  Filled 2013-08-07 (×5): qty 2

## 2013-08-07 MED ORDER — OXYCODONE HCL 5 MG PO TABS
5.0000 mg | ORAL_TABLET | ORAL | Status: DC | PRN
  Administered 2013-08-07 – 2013-08-08 (×4): 5 mg via ORAL
  Filled 2013-08-07 (×4): qty 1

## 2013-08-07 MED ORDER — CEFAZOLIN SODIUM-DEXTROSE 2-3 GM-% IV SOLR
INTRAVENOUS | Status: AC
  Filled 2013-08-07: qty 50

## 2013-08-07 MED ORDER — LIDOCAINE HCL (CARDIAC) 20 MG/ML IV SOLN
INTRAVENOUS | Status: AC
  Filled 2013-08-07: qty 5

## 2013-08-07 MED ORDER — PROMETHAZINE HCL 25 MG/ML IJ SOLN: 6.2500 mg | INTRAMUSCULAR | Status: DC | PRN

## 2013-08-07 MED ORDER — 0.9 % SODIUM CHLORIDE (POUR BTL) OPTIME
TOPICAL | Status: DC | PRN
  Administered 2013-08-07: 1000 mL

## 2013-08-07 MED ORDER — BUPIVACAINE HCL 0.25 % IJ SOLN
INTRAMUSCULAR | Status: DC | PRN
  Administered 2013-08-07: 3 mL

## 2013-08-07 MED ORDER — MIDAZOLAM HCL 2 MG/2ML IJ SOLN
INTRAMUSCULAR | Status: AC
  Filled 2013-08-07: qty 2

## 2013-08-07 MED ORDER — LACTATED RINGERS IV SOLN
INTRAVENOUS | Status: DC
  Administered 2013-08-07: 12:00:00 via INTRAVENOUS
  Administered 2013-08-07: 1000 mL via INTRAVENOUS

## 2013-08-07 MED ORDER — BUPIVACAINE-EPINEPHRINE (PF) 0.25% -1:200000 IJ SOLN
INTRAMUSCULAR | Status: AC
  Filled 2013-08-07: qty 30

## 2013-08-07 MED ORDER — SUCCINYLCHOLINE CHLORIDE 20 MG/ML IJ SOLN
INTRAMUSCULAR | Status: DC | PRN
  Administered 2013-08-07: 100 mg via INTRAVENOUS

## 2013-08-07 MED ORDER — LIDOCAINE HCL (CARDIAC) 20 MG/ML IV SOLN
INTRAVENOUS | Status: DC | PRN
  Administered 2013-08-07: 80 mg via INTRAVENOUS

## 2013-08-07 MED ORDER — NEOSTIGMINE METHYLSULFATE 1 MG/ML IJ SOLN
INTRAMUSCULAR | Status: DC | PRN
  Administered 2013-08-07: 3 mg via INTRAVENOUS

## 2013-08-07 MED ORDER — MIDAZOLAM HCL 5 MG/5ML IJ SOLN
INTRAMUSCULAR | Status: DC | PRN
  Administered 2013-08-07: 2 mg via INTRAVENOUS

## 2013-08-07 MED ORDER — CEFAZOLIN SODIUM-DEXTROSE 2-3 GM-% IV SOLR
2.0000 g | INTRAVENOUS | Status: AC
  Administered 2013-08-07: 2 g via INTRAVENOUS

## 2013-08-07 MED ORDER — CHLORHEXIDINE GLUCONATE 4 % EX LIQD: 1.0000 "application " | Freq: Once | CUTANEOUS | Status: DC

## 2013-08-07 MED ORDER — PROPOFOL 10 MG/ML IV BOLUS
INTRAVENOUS | Status: DC | PRN
  Administered 2013-08-07: 50 mg via INTRAVENOUS
  Administered 2013-08-07: 150 mg via INTRAVENOUS

## 2013-08-07 MED ORDER — FENTANYL CITRATE 0.05 MG/ML IJ SOLN
INTRAMUSCULAR | Status: AC
  Filled 2013-08-07: qty 5

## 2013-08-07 MED ORDER — FENTANYL CITRATE 0.05 MG/ML IJ SOLN
INTRAMUSCULAR | Status: DC | PRN
  Administered 2013-08-07 (×2): 50 ug via INTRAVENOUS
  Administered 2013-08-07: 100 ug via INTRAVENOUS
  Administered 2013-08-07: 50 ug via INTRAVENOUS

## 2013-08-07 MED ORDER — ONDANSETRON HCL 4 MG/2ML IJ SOLN
INTRAMUSCULAR | Status: AC
  Filled 2013-08-07: qty 2

## 2013-08-07 MED ORDER — CISATRACURIUM BESYLATE (PF) 10 MG/5ML IV SOLN
INTRAVENOUS | Status: DC | PRN
  Administered 2013-08-07: 2 mg via INTRAVENOUS
  Administered 2013-08-07: 4 mg via INTRAVENOUS

## 2013-08-07 MED ORDER — HYDROMORPHONE HCL PF 1 MG/ML IJ SOLN: 1.0000 mg | INTRAMUSCULAR | Status: DC | PRN

## 2013-08-07 MED ORDER — ONDANSETRON HCL 4 MG/2ML IJ SOLN
INTRAMUSCULAR | Status: DC | PRN
  Administered 2013-08-07: 4 mg via INTRAVENOUS

## 2013-08-07 MED ORDER — ONDANSETRON HCL 4 MG/2ML IJ SOLN: 4.0000 mg | Freq: Four times a day (QID) | INTRAMUSCULAR | Status: DC | PRN

## 2013-08-07 SURGICAL SUPPLY — 46 items
ADH SKN CLS APL DERMABOND .7 (GAUZE/BANDAGES/DRESSINGS) ×1
APL SKNCLS STERI-STRIP NONHPOA (GAUZE/BANDAGES/DRESSINGS)
ATTRACTOMAT 16X20 MAGNETIC DRP (DRAPES) ×2 IMPLANT
BENZOIN TINCTURE PRP APPL 2/3 (GAUZE/BANDAGES/DRESSINGS) ×1 IMPLANT
BLADE HEX COATED 2.75 (ELECTRODE) ×2 IMPLANT
BLADE SURG 15 STRL LF DISP TIS (BLADE) ×1 IMPLANT
BLADE SURG 15 STRL SS (BLADE) ×2
CANISTER SUCTION 2500CC (MISCELLANEOUS) ×1 IMPLANT
CHLORAPREP W/TINT 26ML (MISCELLANEOUS) ×3 IMPLANT
CLIP TI MEDIUM 6 (CLIP) ×4 IMPLANT
CLIP TI WIDE RED SMALL 6 (CLIP) ×5 IMPLANT
DERMABOND ADVANCED (GAUZE/BANDAGES/DRESSINGS) ×1
DERMABOND ADVANCED .7 DNX12 (GAUZE/BANDAGES/DRESSINGS) IMPLANT
DISSECTOR ROUND CHERRY 3/8 STR (MISCELLANEOUS) ×1 IMPLANT
DRAPE PED LAPAROTOMY (DRAPES) ×2 IMPLANT
DRESSING SURGICEL FIBRLLR 1X2 (HEMOSTASIS) ×1 IMPLANT
DRSG SURGICEL FIBRILLAR 1X2 (HEMOSTASIS) ×2
ELECT REM PT RETURN 9FT ADLT (ELECTROSURGICAL) ×2
ELECTRODE REM PT RTRN 9FT ADLT (ELECTROSURGICAL) ×1 IMPLANT
GAUZE SPONGE 4X4 16PLY XRAY LF (GAUZE/BANDAGES/DRESSINGS) ×1 IMPLANT
GLOVE BIO SURGEON STRL SZ7.5 (GLOVE) ×1 IMPLANT
GLOVE EUDERMIC 7 POWDERFREE (GLOVE) ×1 IMPLANT
GLOVE INDICATOR 8.0 STRL GRN (GLOVE) ×2 IMPLANT
GLOVE SURG SS PI 7.5 STRL IVOR (GLOVE) ×1 IMPLANT
GOWN STRL REUS W/TWL XL LVL3 (GOWN DISPOSABLE) ×6 IMPLANT
KIT BASIN OR (CUSTOM PROCEDURE TRAY) ×2 IMPLANT
MANIFOLD NEPTUNE II (INSTRUMENTS) ×1 IMPLANT
NDL HYPO 25X1 1.5 SAFETY (NEEDLE) ×1 IMPLANT
NEEDLE HYPO 25X1 1.5 SAFETY (NEEDLE) ×2 IMPLANT
NS IRRIG 1000ML POUR BTL (IV SOLUTION) ×2 IMPLANT
PACK BASIC VI WITH GOWN DISP (CUSTOM PROCEDURE TRAY) ×2 IMPLANT
PENCIL BUTTON HOLSTER BLD 10FT (ELECTRODE) ×2 IMPLANT
SHEARS HARMONIC 9CM CVD (BLADE) ×1 IMPLANT
STAPLER VISISTAT 35W (STAPLE) ×1 IMPLANT
STRIP CLOSURE SKIN 1/2X4 (GAUZE/BANDAGES/DRESSINGS) ×1 IMPLANT
SUT MNCRL AB 4-0 PS2 18 (SUTURE) ×2 IMPLANT
SUT SILK 2 0 (SUTURE)
SUT SILK 2-0 18XBRD TIE 12 (SUTURE) IMPLANT
SUT SILK 3 0 (SUTURE) ×2
SUT SILK 3-0 18XBRD TIE 12 (SUTURE) IMPLANT
SUT VIC AB 3-0 SH 18 (SUTURE) ×2 IMPLANT
SYR 20CC LL (SYRINGE) ×2 IMPLANT
SYR BULB IRRIGATION 50ML (SYRINGE) ×2 IMPLANT
TOWEL OR 17X26 10 PK STRL BLUE (TOWEL DISPOSABLE) ×2 IMPLANT
TOWEL OR NON WOVEN STRL DISP B (DISPOSABLE) ×2 IMPLANT
YANKAUER SUCT BULB TIP 10FT TU (MISCELLANEOUS) ×2 IMPLANT

## 2013-08-07 NOTE — Transfer of Care (Signed)
Immediate Anesthesia Transfer of Care Note  Patient: Kimberly Aguilar  Procedure(s) Performed: Procedure(s): PARATHYROIDECTOMY (Right)  Patient Location: PACU  Anesthesia Type:General  Level of Consciousness: awake, alert  and oriented  Airway & Oxygen Therapy: Patient Spontanous Breathing and Patient connected to face mask oxygen  Post-op Assessment: Report given to PACU RN  Post vital signs: Reviewed and stable  Complications: No apparent anesthesia complications

## 2013-08-07 NOTE — Discharge Instructions (Signed)
Parathyroidectomy A parathyroidectomy is surgery to remove one or more parathyroid glands. These glands produce a hormone (parathyroid hormone) that helps control the level of calcium in your body. The glands are very small, about the size of a pea. They are located in your neck, close to your thyroid gland and your Adam's apple. Most people (85%) have four parathyroid glands,some people may have one or two more than that. Hyperparathyroidism is when too much parathyroid hormone is being produced. Usually this is caused by one of the parathyroid glands becoming enlarged, but it can also be caused by more than one of the glands. Hyperparathyroidism is found during blood tests that show high calcium in the blood. Parathyroid hormone levels will also be elevated. Cancer also can cause hyperparathyroidism, but this is rare. For the most common type of hyperparathyroidism, the treatment is surgical removal of the parathyroid gland that is enlarged. For patients with kidney failure and hyperparathyroidism, other treatment will be tried before surgery is done on the parathyroid.  Many times x-ray studies are done to find out which parathyroid gland or glands is malfunctioning. The decision about the best treatment for hyperparathyroidism is between the patient, their primary doctor, an endocrinologist, and a surgeon experienced in parathyroid surgery. LET YOUR CAREGIVER KNOW ABOUT:  Any allergies.  All medications you are taking, including:  Herbs, eyedrops, over-the-counter medications and creams.  Blood thinners (anticoagulants), aspirin or other drugs that could affect blood clotting.  Use of steroids (by mouth or as creams).  Previous problems with anesthetics, including local anesthetics.  Possibility of pregnancy, if this applies.  Any history of blood clots.  Any history of bleeding or other blood problems.  Previous surgery.  Smoking history.  Other health problems. RISKS AND  COMPLICATIONS   Short-term possibilities include:  Excessive bleeding.  Pain.  Infection near the incision.  Slow healing.  Pooling of blood under the wound (hematoma).  Damage to nerves in your neck.  Blood clots.  Difficulty breathing. This is very rare. It also is almost always temporary.  Longer-term possibilities include:  Scarring.  Skin damage.  Damage to blood vessels in the area.  Need for additional surgery.  A hoarse or weak voice. This is usually temporary. It can be the result of nerve damage.  Development of hypoparathyroidism. This means you are not making enough parathyroid hormone. It is rare. If it occurs, you will need to take calcium supplements daily. BEFORE THE PROCEDURE  Sometimes the surgery is done on an outpatient basis. This means you could go home the same day as your surgery. Other times, people need to stay in the hospital overnight. Ask your surgeon what you should expect.  If your surgery will be an outpatient procedure, arrange for someone to drive you home after the surgery.  Two weeks before your surgery, stop using aspirin and non-steroidal anti-inflammatory drugs (NSAID's) for pain relief. This includes prescription drugs and over-the-counter drugs such as ibuprofen and naproxen. Also stop taking vitamin E.  If you take blood-thinners, ask your healthcare provider when you should stop taking them.  Do not eat or drink for about 8 hours before your surgery.  You might be asked to shower or wash with a special antibacterial soap before the procedure.  Arrive at least an hour before the surgery, or whenever your surgeon recommends. This will give you time to check in and fill out any needed paperwork. PROCEDURE  The preparation:  You will change into a hospital gown.  You   will be given an IV. A needle will be inserted in your arm. Medication will be able to flow directly into your body through this needle.  You might be given a  sedative to help you relax.  You will be given a drug that puts you to sleep during the surgery (general anesthetic).  The procedure:  Once you are asleep, the surgeon will make a small cut (incision) in your lower neck. Ask your surgeon where the incision will be.  The surgeon will look for the gland(s) that are not working well. Often a tissue sample from a gland is used to determine this.  Any glands that are not working well will be removed.  The surgeon will close the incision with stitches, often these are hidden under the skin. AFTER THE PROCEDURE  You will stay in a recovery area until the anesthesia has worn off. Your blood pressure and heart rate will be checked.  If your surgery was an outpatient procedure, you will go home the same day.  If you need to stay in the hospital, you will be moved to a hospital room. You will probably stay for two to three days. This will depend on how quickly you recover.  While you are in the hospital, your blood will be tested to check the calcium levels in your body. HOME CARE INSTRUCTIONS   Take any medication that your surgeon prescribes. Follow the directions carefully. Take all of the medication.  Ask your surgeon whether you can take over-the-counter medicines for pain, discomfort or fever. Do not take aspirin without permission from the surgeon. Aspirin increases the chances of bleeding.  Do not get the wound wet for the first few days after surgery (or until the surgeon tells you it is OK).  After this procedure, many patients may develop low calcium levels in the blood. It is critical that you see your medical caregiver to have this monitored and managed.     SEEK MEDICAL CARE IF:   You notice blood or fluid leaking from the wound, or it becomes red or swollen.  You have trouble breathing.  You have trouble speaking.  You become nauseous or throw up for more than two days after the surgery.  You have a fever or  persistent symptoms for more than 2-3 days. SEEK IMMEDIATE MEDICAL CARE IF:   Breathing becomes more difficult.  You have a fever and your symptoms suddenly get worse. Document Released: 07/03/2008 Document Revised: 03/23/2012 Document Reviewed: 07/03/2008 Exit[employer] Patient Information 2014 [employer].  

## 2013-08-07 NOTE — Anesthesia Preprocedure Evaluation (Signed)
Anesthesia Evaluation  Patient identified by MRN, date of birth, ID band Patient awake    Reviewed: Allergy & Precautions, H&P , NPO status , Patient's Chart, lab work & pertinent test results  Airway Mallampati: II TM Distance: >3 FB Neck ROM: Full    Dental no notable dental hx.    Pulmonary former smoker,  breath sounds clear to auscultation  Pulmonary exam normal       Cardiovascular hypertension, Pt. on medications Rhythm:Regular Rate:Normal     Neuro/Psych negative neurological ROS  negative psych ROS   GI/Hepatic negative GI ROS, Neg liver ROS,   Endo/Other  negative endocrine ROS  Renal/GU negative Renal ROS  negative genitourinary   Musculoskeletal negative musculoskeletal ROS (+)   Abdominal   Peds negative pediatric ROS (+)  Hematology negative hematology ROS (+)   Anesthesia Other Findings   Reproductive/Obstetrics negative OB ROS                           Anesthesia Physical Anesthesia Plan  ASA: II  Anesthesia Plan: General   Post-op Pain Management:    Induction: Intravenous  Airway Management Planned: Oral ETT  Additional Equipment:   Intra-op Plan:   Post-operative Plan: Extubation in OR  Informed Consent: I have reviewed the patients History and Physical, chart, labs and discussed the procedure including the risks, benefits and alternatives for the proposed anesthesia with the patient or authorized representative who has indicated his/her understanding and acceptance.   Dental advisory given  Plan Discussed with: CRNA  Anesthesia Plan Comments:         Anesthesia Quick Evaluation

## 2013-08-07 NOTE — Anesthesia Procedure Notes (Signed)
Procedure Name: Intubation Date/Time: 08/07/2013 10:47 AM Performed by: Frances Furbish Pre-anesthesia Checklist: Patient identified, Emergency Drugs available, Suction available and Timeout performed Patient Re-evaluated:Patient Re-evaluated prior to inductionOxygen Delivery Method: Circle system utilized Preoxygenation: Pre-oxygenation with 100% oxygen Intubation Type: IV induction Ventilation: Mask ventilation without difficulty Laryngoscope Size: Mac and 3 Grade View: Grade I Tube type: Oral Tube size: 7.5 mm Number of attempts: 1 Placement Confirmation: ETT inserted through vocal cords under direct vision,  breath sounds checked- equal and bilateral and positive ETCO2 Secured at: 21 cm Tube secured with: Tape Dental Injury: Teeth and Oropharynx as per pre-operative assessment

## 2013-08-07 NOTE — Anesthesia Postprocedure Evaluation (Signed)
  Anesthesia Post-op Note  Patient: Kimberly Aguilar  Procedure(s) Performed: Procedure(s) (LRB): PARATHYROIDECTOMY (Right)  Patient Location: PACU  Anesthesia Type: General  Level of Consciousness: awake and alert   Airway and Oxygen Therapy: Patient Spontanous Breathing  Post-op Pain: mild  Post-op Assessment: Post-op Vital signs reviewed, Patient's Cardiovascular Status Stable, Respiratory Function Stable, Patent Airway and No signs of Nausea or vomiting  Last Vitals:  Filed Vitals:   08/07/13 1330  BP: 151/74  Pulse: 56  Temp: 36.9 C  Resp:     Post-op Vital Signs: stable   Complications: No apparent anesthesia complications

## 2013-08-07 NOTE — H&P (View-Only) (Signed)
Patient ID: Kimberly Aguilar, female   DOB: 1951/04/03, 63 y.o.   MRN: [id number]  Chief Complaint  Patient presents with  . eval thyroid    HPI Kimberly Aguilar is a 63 y.o. female.  The patient is a 63 year old female who is referred by Dr. Jacelyn Grip for evaluation of primary hyperparathyroidism. The patient's at approximately 6 year history of elevated calciums. Patient recently underwent sestamibi scan which reveals a right upper parathyroid adenoma. Patient also has noted some previous bony aches, she's had no history of recurrent kidney stones. She's had no signs or symptoms of mood swings, or digested issues.  HPI  Past Medical History  Diagnosis Date  . Hypercalcemia   . Colon polyp   . Parathyroid adenoma 2015  . Hypertension     Past Surgical History  Procedure Laterality Date  . Cyst removal from left breast      Family History  Problem Relation Age of Onset  . Hypertension Mother   . Hypertension Father   . Congestive Heart Failure Brother   . Heart disease Brother   . Seizures Brother   . Heart disease Brother   . Colon cancer Neg Hx     Social History History  Substance Use Topics  . Smoking status: Former Smoker    Types: Cigarettes    Quit date: 03/31/1973  . Smokeless tobacco: Never Used  . Alcohol Use: No    No Known Allergies  Current Outpatient Prescriptions  Medication Sig Dispense Refill  . acetaminophen (TYLENOL) 500 MG tablet Take 500 mg by mouth as needed.      Marland Kitchen amLODipine (NORVASC) 2.5 MG tablet Take 1 tablet (2.5 mg total) by mouth daily.  30 tablet  3  . Flaxseed, Linseed, (BL FLAX SEED OIL PO) Take by mouth. Take 1 tsp daily       No current facility-administered medications for this visit.    Review of Systems Review of Systems  Constitutional: Negative.   HENT: Negative.   Respiratory: Negative.   Cardiovascular: Negative.   Gastrointestinal: Negative.   Neurological: Negative.   All other systems reviewed and are negative.    Blood  pressure 118/70, pulse 74, temperature 98.1 F (36.7 C), temperature source Oral, height 5\' 2"  (1.575 m), weight 119 lb (53.978 kg).  Physical Exam Physical Exam  Constitutional: She is oriented to person, place, and time. She appears well-developed and well-nourished.  HENT:  Head: Normocephalic and atraumatic.  Eyes: Conjunctivae and EOM are normal. Pupils are equal, round, and reactive to light.  Neck: Normal range of motion. Neck supple. No tracheal deviation present. No thyromegaly present.  Cardiovascular: Normal rate, regular rhythm and normal heart sounds.   Pulmonary/Chest: Effort normal and breath sounds normal.  Abdominal: Soft. Bowel sounds are normal. She exhibits no distension and no mass. There is no tenderness. There is no rebound and no guarding.  Musculoskeletal: Normal range of motion.  Neurological: She is alert and oriented to person, place, and time.  Skin: Skin is warm and dry.  Psychiatric: She has a normal mood and affect.    Data Reviewed Elevated calcium  Sestamibi scan reveals a right upper parathyroid adenoma  Assessment    63 year old female with a right upper parathyroid adenoma     Plan    1 we will proceed to the operating room for excision of the right upper parathyroid adenoma 2. I discussed with her the risks and benefits of the procedure to include but not limited to: Infection, bleeding,  damage to structures, possibly further surgery, and possible need for extended stay. The patient was understanding and wishes to proceed.        Kimberly Aguilar., Kimberly Aguilar 07/13/2013, 9:54 AM

## 2013-08-07 NOTE — Interval H&P Note (Signed)
History and Physical Interval Note:  08/07/2013 10:09 AM  Kimberly Aguilar  has presented today for surgery, with the diagnosis of hyperparathyroid adenoma  The various methods of treatment have been discussed with the patient and family. After consideration of risks, benefits and other options for treatment, the patient has consented to  Procedure(s): PARATHYROIDECTOMY (N/A) as a surgical intervention .  The patient's history has been reviewed, patient examined, no change in status, stable for surgery.  I have reviewed the patient's chart and labs.  Questions were answered to the patient's satisfaction.     [name]

## 2013-08-07 NOTE — Op Note (Signed)
08/07/2013  11:50 AM  PATIENT:  Kimberly Aguilar  63 y.o. female  PRE-OPERATIVE DIAGNOSIS:  parathyroid adenoma  POST-OPERATIVE DIAGNOSIS:  parathyroid adenoma  PROCEDURE:  Procedure(s): PARATHYROIDECTOMY (Right-superior)  SURGEON:  Surgeon(s) and Role:    * [name], MD - Primary    * Adin Hector, MD - Assisting   ANESTHESIA:   local and general  EBL:     BLOOD ADMINISTERED:none  DRAINS: none   LOCAL MEDICATIONS USED:  BUPIVICAINE   SPECIMEN:  Source of Specimen:  Right superior parathyroid gland  DISPOSITION OF SPECIMEN:  PATHOLOGY  COUNTS:  YES  TOURNIQUET:  * No tourniquets in log *  DICTATION: .Dragon Dictation  Details of the procedure:  The patient was taken back to the operating room. The patient was placed in supine position with bilateral SCDs in place. After appropriate anitbiotics were confirmed, a time-out was confirmed and all facts were verified. A 2 cm incision was made approximately 2 fingerbreadths above the sternal notch, and to the right of midline. Bovie cautery was used to maintain hemostasis dissection was carried down through the platysma. The platysma was elevated and flaps were created superiorly and inferiorly. The strap muscles were identified in the midline and separated. Right-sided strap muscles were elevated off the anterior surface of the thyroid. This dissection was carried laterally.  We proceeded to dissect away the superior lobe and Kitners were used to gently dissect the surrounding musculature from the thyroid. Small feeding vessels to the right superior lobe were ligated with clips and the harmonic scalpel.  At this time this partially freed up the superior lobe and we were able to retract this medially. At this time we identified the right superior parathyroid gland. This easily dissected away from surrounding tissue. The artery and vein at the right superior parathyroid gland were clipped with one medium clip. The gland was  removed in its entirety and sent down to pathology for confirmation.  Once confirmation from pathology that this was a parathyroid gland, we proceed to place a piece of fibrillar into the retro-thyroid space. The strap muscles were then reapproximated using 3-0 Vicryl in interrupted fashion. The platysma was also reapproximated using 3-0 Vicryl in interrupted fashion. The skin was reapproximated with 4-0 Monocryl in a subcuticular fashion. The skin was dressed with Dermabond. The patient tolerated the procedure well and was taken to the recovery room in stable condition.   PLAN OF CARE: Admit for overnight observation  PATIENT DISPOSITION:  PACU - hemodynamically stable.   Delay start of Pharmacological VTE agent (>24hrs) due to surgical blood loss or risk of bleeding: not applicable

## 2013-08-08 ENCOUNTER — Encounter (HOSPITAL_COMMUNITY): Payer: Self-pay | Admitting: General Surgery

## 2013-08-08 LAB — BASIC METABOLIC PANEL
BUN: 10 mg/dL (ref 6–23)
CALCIUM: 8.8 mg/dL (ref 8.4–10.5)
CO2: 27 mEq/L (ref 19–32)
Chloride: 100 mEq/L (ref 96–112)
Creatinine, Ser: 0.93 mg/dL (ref 0.50–1.10)
GFR, EST AFRICAN AMERICAN: 75 mL/min — AB (ref >90)
GFR, EST NON AFRICAN AMERICAN: 65 mL/min — AB (ref >90)
GLUCOSE: 138 mg/dL — AB (ref 70–99)
Potassium: 3.7 mEq/L (ref 3.7–5.3)
Sodium: 137 mEq/L (ref 137–147)

## 2013-08-08 LAB — MAGNESIUM: Magnesium: 1.6 mg/dL (ref 1.5–2.5)

## 2013-08-08 LAB — CALCIUM: Calcium: 9.3 mg/dL (ref 8.4–10.5)

## 2013-08-08 MED ORDER — OXYCODONE-ACETAMINOPHEN 5-325 MG PO TABS: 1.0000 | ORAL_TABLET | ORAL | Status: DC | PRN

## 2013-08-08 MED ORDER — MAGNESIUM GLUCONATE 500 MG PO TABS: 500.0000 mg | ORAL_TABLET | Freq: Every day | ORAL | Status: DC

## 2013-08-08 MED ORDER — CALCIUM CARBONATE-VITAMIN D 500-200 MG-UNIT PO TABS: 2.0000 | ORAL_TABLET | Freq: Three times a day (TID) | ORAL | Status: DC

## 2013-08-08 NOTE — Discharge Summary (Signed)
Physician Discharge Summary  Patient ID: Kimberly Aguilar MRN: [id number] DOB/AGE: 62-19-1952 62 y.o.  Admit date: 08/07/2013 Discharge date: 08/08/2013  Admission Diagnoses: s/p parathryoidectomy  Discharge Diagnoses:  Active Problems:   Status post parathyroidectomy   Discharged Condition: good  Hospital Course: PT admitted post op.  Ca were drawn rourintely adn were nromal on DC.  Pt with min pain.  Tol PO  Consults: None  Significant Diagnostic Studies: none  Treatments: surgery: as above  Discharge Exam: Blood pressure 111/65, pulse 73, temperature 98.5 F (36.9 C), temperature source Oral, resp. rate 18, height 5\' 2"  (1.575 m), weight 118 lb (53.524 kg), SpO2 96.00%. General appearance: alert and cooperative Incision/Wound: wound c/d/i  Disposition: Final discharge disposition not confirmed  Discharge Orders   Future Appointments Provider Department Dept Phone   08/21/2013 11:40 AM [name], MD [facility], [place] [phone]   Future Orders Complete By Expires   Diet - low sodium heart healthy  As directed    Increase activity slowly  As directed        Medication List         acetaminophen 500 MG tablet  Commonly known as:  TYLENOL  Take 500 mg by mouth every 6 (six) hours as needed for mild pain or moderate pain.     amLODipine 2.5 MG tablet  Commonly known as:  NORVASC  Take 2.5 mg by mouth every morning.     BL FLAX SEED OIL PO  Take 1 tablet by mouth daily.     calcium-vitamin D 500-200 MG-UNIT per tablet  Commonly known as:  OSCAL WITH D  Take 2 tablets by mouth 3 (three) times daily.     magnesium gluconate 500 MG tablet  Commonly known as:  MAGONATE  Take 1 tablet (500 mg total) by mouth daily.     oxyCODONE-acetaminophen 5-325 MG per tablet  Commonly known as:  ROXICET  Take 1-2 tablets by mouth every 4 (four) hours as needed for severe pain.           Follow-up Information   Follow up with Reyes Ivan, MD.  Schedule an appointment as soon as possible for a visit in 2 weeks. (For wound re-check)    Specialty:  General Surgery   Contact information:   [address] [place] [address] [phone]       Signed: [name] 08/08/2013, 10:28 AM

## 2013-08-08 NOTE — Progress Notes (Signed)
Discharge instructions reviewed with patient and patient's daughter, questions and concerns answered, patient to follow up with MD for post operative check up, incision is within normal limits, patient is tolerating her diet without complaints of nausea or vomiting, iv removed Neta Mends RN 08-08-2013 11:03am

## 2013-08-09 ENCOUNTER — Telehealth (INDEPENDENT_AMBULATORY_CARE_PROVIDER_SITE_OTHER): Payer: Self-pay

## 2013-08-09 NOTE — Telephone Encounter (Signed)
Pt called stating the percocet made her sick and itchy yesterday. She asked if something else can be prescribed. She has not taken anything today since she hasn't been in pain. Advised since she hasn't been in pain today to wait on switching the narcotics. Try OTC pain management since pain is minimal.

## 2013-08-21 ENCOUNTER — Encounter (INDEPENDENT_AMBULATORY_CARE_PROVIDER_SITE_OTHER): Payer: Self-pay | Admitting: General Surgery

## 2013-08-21 ENCOUNTER — Ambulatory Visit (INDEPENDENT_AMBULATORY_CARE_PROVIDER_SITE_OTHER): Payer: BC Managed Care – PPO | Admitting: General Surgery

## 2013-08-21 VITALS — BP 102/70 | HR 68 | Temp 97.8°F | Resp 16 | Ht 62.0 in | Wt 119.0 lb

## 2013-08-21 DIAGNOSIS — Z9889 Other specified postprocedural states: Secondary | ICD-10-CM

## 2013-08-21 DIAGNOSIS — E892 Postprocedural hypoparathyroidism: Secondary | ICD-10-CM

## 2013-08-21 NOTE — Progress Notes (Signed)
Patient ID: Kimberly Aguilar, female   DOB: 01-14-1951, 63 y.o.   MRN: [id number] Post op course The patient is a 63 year old female status post parathyroid adenoma excision. The patient has been doing well postoperatively. His abdominal pain. She does state she notices a resolution of preoperative memory loss as well as he has improved.  On Exam: Wounds clean dry and intact  Pathology:   Parathyroid adenoma.  This was discussed with the patient.  Assessment and Plan 63 year old female status post parathyroid adenoma excision 1. We will obtain laboratory studies today, BPH, calcium levels 2. As long as her studies are within normal limits the patient followup with her PCP on a rate routine basis.   [name], MD [facility] General & Minimally Invasive Surgery Trauma & Emergency Surgery

## 2013-08-22 LAB — PTH, INTACT AND CALCIUM
CALCIUM: 10.4 mg/dL (ref 8.4–10.5)
PTH: 32.7 pg/mL (ref 14.0–72.0)

## 2013-08-24 ENCOUNTER — Telehealth (INDEPENDENT_AMBULATORY_CARE_PROVIDER_SITE_OTHER): Payer: Self-pay

## 2013-08-24 NOTE — Telephone Encounter (Signed)
Called and spoke to patient regarding Normal lab results, patient advised to stop taking the calcium supplement as prescribed.  Patient advised to follow up with PCP.

## 2013-08-24 NOTE — Telephone Encounter (Signed)
Message copied by Ivor Costa on Thu Aug 24, 2013  1:45 PM ------      Message from: [name]      Created: Thu Aug 24, 2013 11:30 AM      Regarding: RE: magnesium       Yes she can stop that also            ----- Message -----         From: Ivor Costa, CMA         Sent: 08/24/2013   9:39 AM           To: [name], MD      Subject: magnesium                                                Spoke to patient and made aware of lab results.  Patient advised to stop taking calicium but, she wants to know if she needs to continue taking magnesium or not?            Jacia Sickman      ----- Message -----         From: [name], MD         Sent: 08/23/2013   8:26 AM           To: Ivor Costa, CMA            Can you call and let her know that her labs were normal and she can f/u with her PCP now.            She can stop taking teh suppl Ca that i had prescribed her.      Thanks                   ------

## 2013-08-24 NOTE — Telephone Encounter (Signed)
Called and left message for patient to call our office RE: Magnesium per Dr. Rosendo Gros patient can stop taking also.

## 2013-08-24 NOTE — Telephone Encounter (Signed)
Message copied by Ivor Costa on Thu Aug 24, 2013  9:33 AM ------      Message from: [name]      Created: Wed Aug 23, 2013  8:26 AM       Can you call and let her know that her labs were normal and she can f/u with her PCP now.            She can stop taking teh suppl Ca that i had prescribed her.      Thanks       ------

## 2013-09-11 ENCOUNTER — Other Ambulatory Visit: Payer: Self-pay | Admitting: *Deleted

## 2013-09-11 MED ORDER — AMLODIPINE BESYLATE 2.5 MG PO TABS: 2.5000 mg | ORAL_TABLET | Freq: Every morning | ORAL | Status: DC

## 2013-11-07 ENCOUNTER — Other Ambulatory Visit: Payer: Self-pay | Admitting: Family

## 2013-11-07 DIAGNOSIS — R928 Other abnormal and inconclusive findings on diagnostic imaging of breast: Secondary | ICD-10-CM

## 2013-11-13 ENCOUNTER — Encounter: Payer: Self-pay | Admitting: Family

## 2013-11-13 ENCOUNTER — Ambulatory Visit (INDEPENDENT_AMBULATORY_CARE_PROVIDER_SITE_OTHER): Payer: BC Managed Care – PPO | Admitting: Family

## 2013-11-13 VITALS — BP 136/68 | HR 64 | Temp 98.6°F | Ht 62.0 in | Wt 120.4 lb

## 2013-11-13 DIAGNOSIS — E213 Hyperparathyroidism, unspecified: Secondary | ICD-10-CM

## 2013-11-13 DIAGNOSIS — I10 Essential (primary) hypertension: Secondary | ICD-10-CM

## 2013-11-13 DIAGNOSIS — D351 Benign neoplasm of parathyroid gland: Secondary | ICD-10-CM

## 2013-11-13 DIAGNOSIS — E785 Hyperlipidemia, unspecified: Secondary | ICD-10-CM

## 2013-11-13 MED ORDER — AMLODIPINE BESYLATE 2.5 MG PO TABS: 2.5000 mg | ORAL_TABLET | Freq: Every morning | ORAL | Status: DC

## 2013-11-13 NOTE — Patient Instructions (Signed)
Health Maintenance Adopting a healthy lifestyle and getting preventive care can go a long way to promote health and wellness. Talk with your health care provider about what schedule of regular examinations is right for you. This is a good chance for you to check in with your provider about disease prevention and staying healthy. In between checkups, there are plenty of things you can do on your own. Experts have done a lot of research about which lifestyle changes and preventive measures are most likely to keep you healthy. Ask your health care provider for more information. WEIGHT AND DIET  Eat a healthy diet  Be sure to include plenty of vegetables, fruits, low-fat dairy products, and lean protein.  Do not eat a lot of foods high in solid fats, added sugars, or salt.  Get regular exercise. This is one of the most important things you can do for your health.  Most adults should exercise for at least 150 minutes each week. The exercise should increase your heart rate and make you sweat (moderate-intensity exercise).  Most adults should also do strengthening exercises at least twice a week. This is in addition to the moderate-intensity exercise.  Maintain a healthy weight  Body mass index (BMI) is a measurement that can be used to identify possible weight problems. It estimates body fat based on height and weight. Your health care provider can help determine your BMI and help you achieve or maintain a healthy weight.  For females 25 years of age and older:   A BMI below 18.5 is considered underweight.  A BMI of 18.5 to 24.9 is normal.  A BMI of 25 to 29.9 is considered overweight.  A BMI of 30 and above is considered obese.  Watch levels of cholesterol and blood lipids  You should start having your blood tested for lipids and cholesterol at 62 years of age, then have this test every 5 years.  You may need to have your cholesterol levels checked more often if:  Your lipid or  cholesterol levels are high.  You are older than 62 years of age.  You are at high risk for heart disease.  CANCER SCREENING   Lung Cancer  Lung cancer screening is recommended for adults 97-92 years old who are at high risk for lung cancer because of a history of smoking.  A yearly low-dose CT scan of the lungs is recommended for people who:  Currently smoke.  Have quit within the past 15 years.  Have at least a 30-pack-year history of smoking. A pack year is smoking an average of one pack of cigarettes a day for 1 year.  Yearly screening should continue until it has been 15 years since you quit.  Yearly screening should stop if you develop a health problem that would prevent you from having lung cancer treatment.  Breast Cancer  Practice breast self-awareness. This means understanding how your breasts normally appear and feel.  It also means doing regular breast self-exams. Let your health care provider know about any changes, no matter how small.  If you are in your 20s or 30s, you should have a clinical breast exam (CBE) by a health care provider every 1-3 years as part of a regular health exam.  If you are 76 or older, have a CBE every year. Also consider having a breast X-ray (mammogram) every year.  If you have a family history of breast cancer, talk to your health care provider about genetic screening.  If you are  at high risk for breast cancer, talk to your health care provider about having an MRI and a mammogram every year.  Breast cancer gene (BRCA) assessment is recommended for women who have family members with BRCA-related cancers. BRCA-related cancers include:  Breast.  Ovarian.  Tubal.  Peritoneal cancers.  Results of the assessment will determine the need for genetic counseling and BRCA1 and BRCA2 testing. Cervical Cancer Routine pelvic examinations to screen for cervical cancer are no longer recommended for nonpregnant women who are considered low  risk for cancer of the pelvic organs (ovaries, uterus, and vagina) and who do not have symptoms. A pelvic examination may be necessary if you have symptoms including those associated with pelvic infections. Ask your health care provider if a screening pelvic exam is right for you.   The Pap test is the screening test for cervical cancer for women who are considered at risk.  If you had a hysterectomy for a problem that was not cancer or a condition that could lead to cancer, then you no longer need Pap tests.  If you are older than 65 years, and you have had normal Pap tests for the past 10 years, you no longer need to have Pap tests.  If you have had past treatment for cervical cancer or a condition that could lead to cancer, you need Pap tests and screening for cancer for at least 20 years after your treatment.  If you no longer get a Pap test, assess your risk factors if they change (such as having a new sexual partner). This can affect whether you should start being screened again.  Some women have medical problems that increase their chance of getting cervical cancer. If this is the case for you, your health care provider may recommend more frequent screening and Pap tests.  The human papillomavirus (HPV) test is another test that may be used for cervical cancer screening. The HPV test looks for the virus that can cause cell changes in the cervix. The cells collected during the Pap test can be tested for HPV.  The HPV test can be used to screen women 30 years of age and older. Getting tested for HPV can extend the interval between normal Pap tests from three to five years.  An HPV test also should be used to screen women of any age who have unclear Pap test results.  After 62 years of age, women should have HPV testing as often as Pap tests.  Colorectal Cancer  This type of cancer can be detected and often prevented.  Routine colorectal cancer screening usually begins at 62 years of  age and continues through 62 years of age.  Your health care provider may recommend screening at an earlier age if you have risk factors for colon cancer.  Your health care provider may also recommend using home test kits to check for hidden blood in the stool.  A small camera at the end of a tube can be used to examine your colon directly (sigmoidoscopy or colonoscopy). This is done to check for the earliest forms of colorectal cancer.  Routine screening usually begins at age 50.  Direct examination of the colon should be repeated every 5-10 years through 62 years of age. However, you may need to be screened more often if early forms of precancerous polyps or small growths are found. Skin Cancer  Check your skin from head to toe regularly.  Tell your health care provider about any new moles or changes in   moles, especially if there is a change in a mole's shape or color.  Also tell your health care provider if you have a mole that is larger than the size of a pencil eraser.  Always use sunscreen. Apply sunscreen liberally and repeatedly throughout the day.  Protect yourself by wearing long sleeves, pants, a wide-brimmed hat, and sunglasses whenever you are outside. HEART DISEASE, DIABETES, AND HIGH BLOOD PRESSURE   Have your blood pressure checked at least every 1-2 years. High blood pressure causes heart disease and increases the risk of stroke.  If you are between 75 years and 42 years old, ask your health care provider if you should take aspirin to prevent strokes.  Have regular diabetes screenings. This involves taking a blood sample to check your fasting blood sugar level.  If you are at a normal weight and have a low risk for diabetes, have this test once every three years after 62 years of age.  If you are overweight and have a high risk for diabetes, consider being tested at a younger age or more often. PREVENTING INFECTION  Hepatitis B  If you have a higher risk for  hepatitis B, you should be screened for this virus. You are considered at high risk for hepatitis B if:  You were born in a country where hepatitis B is common. Ask your health care provider which countries are considered high risk.  Your parents were born in a high-risk country, and you have not been immunized against hepatitis B (hepatitis B vaccine).  You have HIV or AIDS.  You use needles to inject street drugs.  You live with someone who has hepatitis B.  You have had sex with someone who has hepatitis B.  You get hemodialysis treatment.  You take certain medicines for conditions, including cancer, organ transplantation, and autoimmune conditions. Hepatitis C  Blood testing is recommended for:  Everyone born from [birth year] through 1964.  Anyone with known risk factors for hepatitis C. Sexually transmitted infections (STIs)  You should be screened for sexually transmitted infections (STIs) including gonorrhea and chlamydia if:  You are sexually active and are younger than 62 years of age.  You are older than 62 years of age and your health care provider tells you that you are at risk for this type of infection.  Your sexual activity has changed since you were last screened and you are at an increased risk for chlamydia or gonorrhea. Ask your health care provider if you are at risk.  If you do not have HIV, but are at risk, it may be recommended that you take a prescription medicine daily to prevent HIV infection. This is called pre-exposure prophylaxis (PrEP). You are considered at risk if:  You are sexually active and do not regularly use condoms or know the HIV status of your partner(s).  You take drugs by injection.  You are sexually active with a partner who has HIV. Talk with your health care provider about whether you are at high risk of being infected with HIV. If you choose to begin PrEP, you should first be tested for HIV. You should then be tested every 3 months for  as long as you are taking PrEP.  PREGNANCY   If you are premenopausal and you may become pregnant, ask your health care provider about preconception counseling.  If you may become pregnant, take 400 to 800 micrograms (mcg) of folic acid every day.  If you want to prevent pregnancy, talk to your  health care provider about birth control (contraception). OSTEOPOROSIS AND MENOPAUSE   Osteoporosis is a disease in which the bones lose minerals and strength with aging. This can result in serious bone fractures. Your risk for osteoporosis can be identified using a bone density scan.  If you are 60 years of age or older, or if you are at risk for osteoporosis and fractures, ask your health care provider if you should be screened.  Ask your health care provider whether you should take a calcium or vitamin D supplement to lower your risk for osteoporosis.  Menopause may have certain physical symptoms and risks.  Hormone replacement therapy may reduce some of these symptoms and risks. Talk to your health care provider about whether hormone replacement therapy is right for you.  HOME CARE INSTRUCTIONS   Schedule regular health, dental, and eye exams.  Stay current with your immunizations.   Do not use any tobacco products including cigarettes, chewing tobacco, or electronic cigarettes.  If you are pregnant, do not drink alcohol.  If you are breastfeeding, limit how much and how often you drink alcohol.  Limit alcohol intake to no more than 1 drink per day for nonpregnant women. One drink equals 12 ounces of beer, 5 ounces of wine, or 1 ounces of hard liquor.  Do not use street drugs.  Do not share needles.  Ask your health care provider for help if you need support or information about quitting drugs.  Tell your health care provider if you often feel depressed.  Tell your health care provider if you have ever been abused or do not feel safe at home. Document Released: 10/20/2010  Document Revised: 08/21/2013 Document Reviewed: 03/08/2013 [facility] Patient Information 2015 [place], [place]. This information is not intended to replace advice given to you by your health care provider. Make sure you discuss any questions you have with your health care provider. Parathyroid Hormone This is a test to determine whether PTH levels are responding normally to changes in blood calcium levels. It also helps to distinguish the cause of calcium imbalances, and to evaluate parathyroid function when calcium blood levels are higher or lower than normal, and when your caregiver may want to determine how well your parathyroid glands are working. Parathyroid hormone (PTH) helps the body maintain stable levels of calcium in the blood. It is part of a "feedback loop" that includes calcium, PTH, vitamin D, and, to some extent, phosphate and magnesium. Conditions and diseases that disrupt this feedback loop can cause inappropriate elevations or decreases in calcium and PTH levels and lead to symptoms of hypercalcemia (raised blood levels of calcium) or hypocalcemia (low blood levels of calcium).  PTH is produced by four parathyroid glands that are located in the neck beside the thyroid gland. Normally, these glands secrete PTH into the bloodstream in response to low blood calcium levels. Parathyroid hormone then works in three ways to help raise blood calcium levels back to normal. It takes calcium from the body's bone, stimulates the activation of vitamin D in the kidney (which in turn increases the absorption of calcium from the intestines), and suppresses the excretion of calcium in the urine (while encouraging excretion of phosphate). As calcium levels begin to increase in the blood, PTH normally decreases. PREPARATION FOR TEST You should have nothing to eat or drink except for water after midnight on the day of the test or as directed by your caregiver. A blood sample is obtained by inserting a needle  into a vein in  the arm. NORMAL FINDINGS Conventional Normal  PTH intact (whole).  Assay includes intact PTH.  Values (pg/mL) 10-65.  SI Units (ng/L) 10-65.  PTH N-terminal.  Values (pg/mL) 8-24.  SI Units (ng/L)8-24.  PTH C-terminal.  Assay includes C-terminal.  Values (pg/mL) 50-330.  SI Units (ng/L) 50-330.  Intact PTH.  Midmolecule. Ranges for normal findings may vary among different laboratories and hospitals. You should always check with your doctor after having lab work or other tests done to discuss the meaning of your test results and whether your values are considered within normal limits. MEANING OF TEST  Your caregiver will go over the test results with you and discuss the importance and meaning of your results, as well as treatment options and the need for additional tests if necessary. OBTAINING THE TEST RESULTS  It is your responsibility to obtain your test results. Ask the lab or department performing the test when and how you will get your results. Document Released: 05/09/2004 Document Revised: 08/21/2013 Document Reviewed: 03/18/2008 [facility] Patient Information 2015 [place], [place]. This information is not intended to replace advice given to you by your health care provider. Make sure you discuss any questions you have with your health care provider.

## 2013-11-13 NOTE — Progress Notes (Signed)
   Subjective:    Patient ID: Kimberly Aguilar, female    DOB: [birth year], 63 y.o.   MRN: [id number]  Hypertension This is a chronic problem. The current episode started more than 1 year ago. The problem has been resolved since onset. The problem is controlled. Pertinent negatives include no anxiety, headaches, palpitations, peripheral edema or shortness of breath. Risk factors for coronary artery disease include post-menopausal state and family history. Past treatments include calcium channel blockers. The current treatment provides moderate improvement. Hypertensive end-organ damage includes a thyroid problem. There is no history of kidney disease, CAD/MI or heart failure. There is no history of sleep apnea.   *Pt had surgery in August 07, 2013 on right parathyroid gland. Pt states she was having high levels of Ca+   Review of Systems  Constitutional: Negative.   HENT: Negative.   Eyes: Negative.   Respiratory: Negative.  Negative for shortness of breath.   Cardiovascular: Negative.  Negative for palpitations.  Gastrointestinal: Negative.   Endocrine: Negative.   Genitourinary: Negative.   Musculoskeletal: Negative.   Neurological: Negative.  Negative for headaches.  Hematological: Negative.   Psychiatric/Behavioral: Negative.   All other systems reviewed and are negative.      Objective:   Physical Exam  Vitals reviewed. Constitutional: She is oriented to person, place, and time. She appears well-developed and well-nourished. No distress.  HENT:  Head: Normocephalic and atraumatic.  Right Ear: External ear normal.  Left Ear: External ear normal.  Nose: Nose normal.  Mouth/Throat: Oropharynx is clear and moist.  Eyes: Pupils are equal, round, and reactive to light.  Neck: Normal range of motion. Neck supple. No thyromegaly present.  Cardiovascular: Normal rate, regular rhythm, normal heart sounds and intact distal pulses.   No murmur heard. Pulmonary/Chest: Effort normal and  breath sounds normal. No respiratory distress. She has no wheezes.  Abdominal: Soft. Bowel sounds are normal. She exhibits no distension. There is no tenderness.  Musculoskeletal: Normal range of motion. She exhibits no edema and no tenderness.  Neurological: She is alert and oriented to person, place, and time. She has normal reflexes. No cranial nerve deficit.  Skin: Skin is warm and dry.  Psychiatric: She has a normal mood and affect. Her behavior is normal. Judgment and thought content normal.    BP 136/68  Pulse 64  Temp(Src) 98.6 F (37 C) (Oral)  Ht $R'5\' 2"'Ub$  (1.575 m)  Wt 120 lb 6.4 oz (54.613 kg)  BMI 22.02 kg/m2       Assessment & Plan:  1. Essential hypertension - CMP14+EGFR - amLODipine (NORVASC) 2.5 MG tablet; Take 1 tablet (2.5 mg total) by mouth every morning.  Dispense: 90 tablet; Refill: 3  2. Hypercalcemia - CMP14+EGFR  3. Hyperparathyroidism - CMP14+EGFR - Vit D  25 hydroxy (rtn osteoporosis monitoring) - Thyroid Panel With TSH  4. Parathyroid adenoma - CMP14+EGFR - Vit D  25 hydroxy (rtn osteoporosis monitoring) - Thyroid Panel With TSH  5. Hyperlipidemia LDL goal <100 - Lipid panel   Continue all meds Labs pending Health Maintenance reviewed-Pt has mammogram scheduled in August , Bone density scan scheduled  Diet and exercise encouraged RTO 3 months  Evelina Dun, FNP

## 2013-11-14 ENCOUNTER — Telehealth: Payer: Self-pay | Admitting: Family Medicine

## 2013-11-14 ENCOUNTER — Encounter: Payer: Self-pay | Admitting: Family Medicine

## 2013-11-14 LAB — CMP14+EGFR
A/G RATIO: 1.8 (ref 1.1–2.5)
ALK PHOS: 65 IU/L (ref 39–117)
ALT: 10 IU/L (ref 0–32)
AST: 15 IU/L (ref 0–40)
Albumin: 4.4 g/dL (ref 3.6–4.8)
BUN / CREAT RATIO: 13 (ref 11–26)
BUN: 13 mg/dL (ref 8–27)
CALCIUM: 9.5 mg/dL (ref 8.7–10.3)
CO2: 23 mmol/L (ref 18–29)
CREATININE: 0.98 mg/dL (ref 0.57–1.00)
Chloride: 104 mmol/L (ref 97–108)
GFR, EST AFRICAN AMERICAN: 71 mL/min/{1.73_m2} (ref >59)
GFR, EST NON AFRICAN AMERICAN: 62 mL/min/{1.73_m2} (ref >59)
GLOBULIN, TOTAL: 2.4 g/dL (ref 1.5–4.5)
Glucose: 92 mg/dL (ref 65–99)
POTASSIUM: 4.2 mmol/L (ref 3.5–5.2)
SODIUM: 142 mmol/L (ref 134–144)
Total Bilirubin: 0.4 mg/dL (ref 0.0–1.2)
Total Protein: 6.8 g/dL (ref 6.0–8.5)

## 2013-11-14 LAB — THYROID PANEL WITH TSH
FREE THYROXINE INDEX: 2 (ref 1.2–4.9)
T3 UPTAKE RATIO: 30 % (ref 24–39)
T4, Total: 6.5 ug/dL (ref 4.5–12.0)
TSH: 4.32 u[IU]/mL (ref 0.450–4.500)

## 2013-11-14 LAB — LIPID PANEL
CHOL/HDL RATIO: 2.2 ratio (ref 0.0–4.4)
Cholesterol, Total: 193 mg/dL (ref 100–199)
HDL: 88 mg/dL (ref >39)
LDL Calculated: 96 mg/dL (ref 0–99)
Triglycerides: 45 mg/dL (ref 0–149)
VLDL Cholesterol Cal: 9 mg/dL (ref 5–40)

## 2013-11-14 LAB — VITAMIN D 25 HYDROXY (VIT D DEFICIENCY, FRACTURES): VIT D 25 HYDROXY: 41.9 ng/mL (ref 30.0–100.0)

## 2013-11-14 NOTE — Telephone Encounter (Signed)
Message copied by Waverly Ferrari on Tue Nov 14, 2013  9:17 AM ------      Message from: [place], [place] A      Created: Tue Nov 14, 2013  8:58 AM       Kidney and liver function stable      Cholesterol levels WNL      Vit D levels WNL      Thyroid levels WNL ------

## 2013-12-12 ENCOUNTER — Ambulatory Visit (HOSPITAL_COMMUNITY)
Admission: RE | Admit: 2013-12-12 | Discharge: 2013-12-12 | Disposition: A | Discharge status: Home / Self Care | Payer: BC Managed Care – PPO | Source: Ambulatory Visit | Attending: Family | Admitting: Family

## 2013-12-12 DIAGNOSIS — R928 Other abnormal and inconclusive findings on diagnostic imaging of breast: Secondary | ICD-10-CM

## 2013-12-12 DIAGNOSIS — N63 Unspecified lump in unspecified breast: Secondary | ICD-10-CM | POA: Diagnosis present

## 2013-12-27 ENCOUNTER — Encounter: Payer: Self-pay | Admitting: Pharmacist

## 2013-12-27 ENCOUNTER — Ambulatory Visit (INDEPENDENT_AMBULATORY_CARE_PROVIDER_SITE_OTHER): Payer: BC Managed Care – PPO

## 2013-12-27 ENCOUNTER — Ambulatory Visit (INDEPENDENT_AMBULATORY_CARE_PROVIDER_SITE_OTHER): Payer: BC Managed Care – PPO | Admitting: Pharmacist

## 2013-12-27 VITALS — Ht 62.0 in | Wt 120.0 lb

## 2013-12-27 DIAGNOSIS — D351 Benign neoplasm of parathyroid gland: Secondary | ICD-10-CM

## 2013-12-27 DIAGNOSIS — Z13820 Encounter for screening for osteoporosis: Secondary | ICD-10-CM

## 2013-12-27 DIAGNOSIS — E213 Hyperparathyroidism, unspecified: Secondary | ICD-10-CM

## 2013-12-27 NOTE — Patient Instructions (Signed)

## 2013-12-27 NOTE — Progress Notes (Signed)
Patient ID: Kimberly Aguilar, female   DOB: 1951-02-16, 63 y.o.   MRN: [id number]  [facility] Current Height: Height: 5\' 2"  (157.5 cm)      Max Lifetime Height:  5\' 2"  Current Weight: Weight: 120 lb (54.432 kg)       Ethnicity:African American    HPI: History of parathyroid adenoma 07/2013;  Hypercalcemia.  No history of osteopenia or osteoporosis  Back Pain?  No       Kyphosis?  No Prior fracture?  No Med(s) for Osteoporosis/Osteopenia:  none Med(s) previously tried for Osteoporosis/Osteopenia:  none                                                             PMH: Age at menopause:  Early 25's Hysterectomy?  No Oophorectomy?  No HRT? No Steroid Use?  No Thyroid med?  No History of cancer?  Yes - parathyroid adenoma History of digestive disorders (ie Crohn's)?  No Current or previous eating disorders?  No Last Vitamin D Result:  41.9 (10/2013) Last GFR Result:  71 (10/2013)   FH/SH: Family history of osteoporosis?  No Parent with history of hip fracture?  No Family history of breast cancer?  Yes - paternal aunts Exercise?  Yes - [place] Smoking?  No Alcohol?  No    Calcium Assessment Calcium Intake  # of servings/day  Calcium mg  Milk (8 oz) 0.5  x  300  = 150mg   Yogurt (4 oz) 0.5 x  200 = 100mg   Cheese (1 oz) 0 x  200 = 0  Other Calcium sources Daily OJ with calcium  300mg  + 250mg   Ca supplement 0 = 0   Estimated calcium intake per day 800mg     DEXA Results Date of Test T-Score for AP Spine L1-L4 T-Score for Total Left Hip T-Score for Total Right Hip  12/27/2013 -0.8 0.7 0.2                   Assessment: Normal BMD with history of hypercalcemia and parathyroid adenoma  Recommendations: 1.  Discussed function of parathyroid.  Discussed BMD results and fracture risk. 2.  recommend calcium 1200mg  daily through supplementation or diet.  3.  continue weight bearing exercise - 30 minutes at least 4 days per week.   4.  Counseled and educated about fall  risk and prevention.  Recheck DEXA:  2 years  Time spent counseling patient:  15 minutes  Cherre Robins, PharmD, CPP

## 2014-02-22 ENCOUNTER — Encounter: Payer: Self-pay | Admitting: Family Medicine

## 2014-02-22 ENCOUNTER — Ambulatory Visit (INDEPENDENT_AMBULATORY_CARE_PROVIDER_SITE_OTHER): Payer: BC Managed Care – PPO | Admitting: Family Medicine

## 2014-02-22 VITALS — BP 122/72 | HR 68 | Temp 98.1°F | Ht 62.0 in | Wt 121.4 lb

## 2014-02-22 DIAGNOSIS — I10 Essential (primary) hypertension: Secondary | ICD-10-CM

## 2014-02-22 DIAGNOSIS — E213 Hyperparathyroidism, unspecified: Secondary | ICD-10-CM

## 2014-02-22 LAB — POCT UA - MICROALBUMIN: Microalbumin Ur, POC: 20 mg/L

## 2014-02-22 NOTE — Addendum Note (Signed)
Addended by: Earlene Plater on: 02/22/2014 11:54 AM   Modules accepted: Miquel Dunn

## 2014-02-22 NOTE — Progress Notes (Signed)
   Subjective:    Patient ID: Kimberly Aguilar, female    DOB: 18-Feb-1951, 63 y.o.   MRN: [id number]  HPI 63 year old female here for follow-up hypertension and hyperparathyroidism. She feels well with no complaints. Compliance is good with medications. She is on minimal medications only 1 prescription meds admitting amlodipine 2-1/2 mg.    Review of Systems     Objective:   Physical Exam  Constitutional: She is oriented to person, place, and time. She appears well-developed and well-nourished.  Eyes: Conjunctivae and EOM are normal.  Neck: Normal range of motion. Neck supple.  Cardiovascular: Normal rate, regular rhythm and normal heart sounds.   Pulmonary/Chest: Effort normal and breath sounds normal.  Abdominal: Soft. Bowel sounds are normal.  Musculoskeletal: Normal range of motion.  Neurological: She is alert and oriented to person, place, and time. She has normal reflexes.  Skin: Skin is warm and dry.  Psychiatric: She has a normal mood and affect. Her behavior is normal. Thought content normal.    BP 122/72 mmHg  Pulse 68  Temp(Src) 98.1 F (36.7 C) (Oral)  Ht $R'5\' 2"'wK$  (1.575 m)  Wt 121 lb 6.4 oz (55.067 kg)  BMI 22.20 kg/m2      Assessment & Plan:  1. Essential hypertension Continue with amlodipine 2.5 mg - POCT UA - Microalbumin  2. Hyperparathyroidism We'll check serum calcium as well as renal function today - CMP14+EGFR  Wardell Honour MD

## 2014-02-22 NOTE — Addendum Note (Signed)
Addended by: Earlene Plater on: 02/22/2014 12:34 PM   Modules accepted: Orders, SmartSet

## 2014-02-23 LAB — CMP14+EGFR
ALK PHOS: 69 IU/L (ref 39–117)
ALT: 10 IU/L (ref 0–32)
AST: 19 IU/L (ref 0–40)
Albumin/Globulin Ratio: 1.5 (ref 1.1–2.5)
Albumin: 4.2 g/dL (ref 3.6–4.8)
BUN / CREAT RATIO: 12 (ref 11–26)
BUN: 13 mg/dL (ref 8–27)
CHLORIDE: 101 mmol/L (ref 97–108)
CO2: 25 mmol/L (ref 18–29)
Calcium: 9.7 mg/dL (ref 8.7–10.3)
Creatinine, Ser: 1.11 mg/dL — ABNORMAL HIGH (ref 0.57–1.00)
GFR calc Af Amer: 62 mL/min/{1.73_m2} (ref >59)
GFR calc non Af Amer: 53 mL/min/{1.73_m2} — ABNORMAL LOW (ref >59)
GLOBULIN, TOTAL: 2.8 g/dL (ref 1.5–4.5)
Glucose: 93 mg/dL (ref 65–99)
Potassium: 4.3 mmol/L (ref 3.5–5.2)
SODIUM: 141 mmol/L (ref 134–144)
Total Bilirubin: 0.3 mg/dL (ref 0.0–1.2)
Total Protein: 7 g/dL (ref 6.0–8.5)

## 2014-02-23 LAB — MICROALBUMIN, URINE: MICROALBUM., U, RANDOM: 8.4 ug/mL (ref 0.0–17.0)

## 2014-03-02 ENCOUNTER — Telehealth: Payer: Self-pay | Admitting: Family Medicine

## 2014-03-02 NOTE — Telephone Encounter (Signed)
-----   Message from Wardell Honour, MD sent at 03/02/2014 12:59 PM EST ----- Labs are okay but have slight elevation of creatinine. Would suggest repeating labs after good hydration in about 2-3 months

## 2014-03-09 ENCOUNTER — Telehealth: Payer: Self-pay

## 2014-03-09 NOTE — Telephone Encounter (Signed)
-----   Message from Wardell Honour, MD sent at 03/02/2014 12:59 PM EST ----- Labs are okay but have slight elevation of creatinine. Would suggest repeating labs after good hydration in about 2-3 months

## 2014-03-09 NOTE — Telephone Encounter (Signed)
Lm on home am with lab results and need for repeat in 2-3 months as per DPR of 10/14

## 2014-08-07 ENCOUNTER — Encounter: Payer: Self-pay | Admitting: Physician Assistant

## 2014-08-07 ENCOUNTER — Ambulatory Visit (INDEPENDENT_AMBULATORY_CARE_PROVIDER_SITE_OTHER): Payer: 59 | Admitting: Physician Assistant

## 2014-08-07 VITALS — BP 141/72 | HR 86 | Temp 97.8°F | Ht 62.0 in | Wt 122.0 lb

## 2014-08-07 DIAGNOSIS — L03313 Cellulitis of chest wall: Secondary | ICD-10-CM

## 2014-08-07 MED ORDER — DOXYCYCLINE HYCLATE 100 MG PO TABS: 100.0000 mg | ORAL_TABLET | Freq: Two times a day (BID) | ORAL | Status: DC

## 2014-08-07 MED ORDER — HYDROCORTISONE 2.5 % EX OINT: TOPICAL_OINTMENT | Freq: Two times a day (BID) | CUTANEOUS | Status: DC

## 2014-08-07 NOTE — Progress Notes (Signed)
   Subjective:    Patient ID: Kimberly Aguilar, female    DOB: [birth year], 64 y.o.   MRN: [id number]  HPI  64 y/o female presents with c/o red pruritic rash on left chest and right arm x 3 days. She was asleep and felt something on her chest like a "bite" but did not see what it was. Shooting pain. Has been using neosporin and hydrocortisone with some relief   Review of Systems  Constitutional: Negative for fever, chills, diaphoresis and fatigue.  Gastrointestinal: Negative.   Skin: Positive for color change (redness on rue and right chest) and rash.       Objective:   Physical Exam  Constitutional: She is oriented to person, place, and time. She appears well-developed and well-nourished. No distress.  Pulmonary/Chest: Effort normal and breath sounds normal.  Neurological: She is alert and oriented to person, place, and time.  Skin: She is not diaphoretic. There is erythema (localized area of erythema on left chest with inferior spreading. No streaking. Poorly defined borders. Localized annular lesion on RUE tricep area with mild erytthema ).  Psychiatric: She has a normal mood and affect. Her behavior is normal. Judgment and thought content normal.  Nursing note and vitals reviewed.         Assessment & Plan:  1. Cellulitis of chest wall   - doxycycline (VIBRA-TABS) 100 MG tablet; Take 1 tablet (100 mg total) by mouth 2 (two) times daily.  Dispense: 20 tablet; Refill: 0 - hydrocortisone 2.5 % ointment; Apply topically 2 (two) times daily.  Dispense: 30 g; Refill: 0  F/U in 1 week

## 2014-08-07 NOTE — Addendum Note (Signed)
Addended by: Marline Backbone A on: 08/07/2014 12:07 PM   Modules accepted: Orders

## 2014-08-07 NOTE — Patient Instructions (Signed)

## 2014-08-09 LAB — AEROBIC CULTURE

## 2014-08-16 ENCOUNTER — Encounter: Payer: Self-pay | Admitting: Physician Assistant

## 2014-08-16 ENCOUNTER — Ambulatory Visit (INDEPENDENT_AMBULATORY_CARE_PROVIDER_SITE_OTHER): Payer: 59 | Admitting: Physician Assistant

## 2014-08-16 VITALS — BP 125/68 | HR 77 | Temp 98.2°F | Ht 62.0 in | Wt 120.0 lb

## 2014-08-16 DIAGNOSIS — L298 Other pruritus: Secondary | ICD-10-CM | POA: Diagnosis not present

## 2014-08-16 MED ORDER — CETIRIZINE HCL 10 MG PO TABS: 10.0000 mg | ORAL_TABLET | Freq: Every day | ORAL | Status: DC

## 2014-08-16 NOTE — Patient Instructions (Signed)
-   CONTINUE TO USE DOVE SOAP - DECREASE HYDROCORTISONE APPLICATION TO ONCE DAILY  - TAKE ZYRTEC AS DIRECTED ONCE DAILY FOR [facility]

## 2014-08-16 NOTE — Progress Notes (Signed)
   Subjective:    Patient ID: Kimberly Aguilar, female    DOB: 02-27-1951, 64 y.o.   MRN: [id number]  HPI Patient presetnt for f/u of cellulitis of chest. Seen initially on 08/07/14. She took the Doxycycline 100mg  BID. She has 1 more day. Has used the  Hydrocortisone BID since initial visit. Has had significant relief in inflammation and itch.     Review of Systems  Skin: Positive for color change.       Decreased inflammation , itch, redness on left upper chest and right upper arm       Objective:   Physical Exam  Skin:  hyperpigmentation on left upper chest and right upper arm. Decreased from initial visit. Negative for inflammation or eythema. Nontender to palpation           Assessment & Plan:  1. Pruritic erythematous rash - Continue to use Dove soap - decrease Hydrocortison to once daily  - cetirizine (ZYRTEC) 10 MG tablet; Take 1 tablet (10 mg total) by mouth daily.  Dispense: 30 tablet; Refill: 11   Continue all meds   RTO prn   Sarahgrace Broman A. Benjamin Stain PA-C

## 2014-10-03 ENCOUNTER — Ambulatory Visit (INDEPENDENT_AMBULATORY_CARE_PROVIDER_SITE_OTHER): Payer: 59 | Admitting: Family Medicine

## 2014-10-03 ENCOUNTER — Encounter: Payer: Self-pay | Admitting: Family Medicine

## 2014-10-03 VITALS — BP 101/62 | HR 66 | Temp 97.2°F | Ht 62.0 in | Wt 121.0 lb

## 2014-10-03 DIAGNOSIS — I10 Essential (primary) hypertension: Secondary | ICD-10-CM

## 2014-10-03 NOTE — Progress Notes (Signed)
   Subjective:    Patient ID: Kimberly Aguilar, female    DOB: [birth year], 64 y.o.   MRN: [id number]  HPI 64 year old female here to follow-up hypertension and hypercalcemia. Regarding the latter, she had parathyroidectomy about 1 year ago. There are no other complaints. She is compliant with her blood pressure medicine.    Review of Systems  Constitutional: Negative.   HENT: Negative.   Eyes: Negative.   Respiratory: Negative.   Cardiovascular: Negative.   Gastrointestinal: Negative.   Endocrine: Negative.   Genitourinary: Negative.   Hematological: Negative.   Psychiatric/Behavioral: Negative.    Patient Active Problem List   Diagnosis Date Noted  . 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 Parathyroid adenoma   . Hypertension   . Hyperparathyroidism 06/30/2013  . Personal history of colonic polyps 06/12/2013  . Colon polyp   . 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 Abnormal TSH 03/31/2013  . Hypercalcemia 03/31/2013   Outpatient Encounter Prescriptions as of 10/03/2014  Medication Sig  . acetaminophen (TYLENOL) 500 MG tablet Take 500 mg by mouth every 6 (six) hours as needed for mild pain or moderate pain.   Marland Kitchen amLODipine (NORVASC) 2.5 MG tablet Take 1 tablet (2.5 mg total) by mouth every morning.  . Flaxseed, Linseed, (BL FLAX SEED OIL PO) Take 1 tablet by mouth daily.   . [DISCONTINUED] cetirizine (ZYRTEC) 10 MG tablet Take 1 tablet (10 mg total) by mouth daily.  . [DISCONTINUED] doxycycline (VIBRA-TABS) 100 MG tablet Take 1 tablet (100 mg total) by mouth 2 (two) times daily.  . [DISCONTINUED] hydrocortisone 2.5 % ointment Apply topically 2 (two) times daily.   No facility-administered encounter medications on file as of 10/03/2014.       Objective:   Physical Exam  Constitutional: She is oriented to person, place, and time. She appears well-developed and well-nourished.  Eyes: Conjunctivae and EOM are normal.  Neck: Normal range of motion. Neck supple.    Cardiovascular: Normal rate, regular rhythm and normal heart sounds.   Pulmonary/Chest: Effort normal and breath sounds normal.  Abdominal: Soft. Bowel sounds are normal.  Musculoskeletal: Normal range of motion.  Neurological: She is alert and oriented to person, place, and time. She has normal reflexes.  Skin: Skin is warm and dry.  Psychiatric: She has a normal mood and affect. Her behavior is normal. Thought content normal.          Assessment & Plan:  1. Essential hypertension Pressures are well controlled. Continue with amlodipine 2.5 mg daily - CMP14+EGFR  2. Hypercalcemia Last calcium was good I think if calcium is normal today he can be checked yearly thereafter - CMP14+EGFR   Wardell Honour MD

## 2014-10-03 NOTE — Progress Notes (Signed)
   Subjective:    Patient ID: Kimberly Aguilar, female    DOB: [birth year], 64 y.o.   MRN: [id number]  HPI  Patient Active Problem List   Diagnosis Date Noted  . 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 Parathyroid adenoma   . Hypertension   . Hyperparathyroidism 06/30/2013  . Personal history of colonic polyps 06/12/2013  . Colon polyp   . 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 Abnormal TSH 03/31/2013  . Hypercalcemia 03/31/2013   Outpatient Encounter Prescriptions as of 10/03/2014  Medication Sig  . acetaminophen (TYLENOL) 500 MG tablet Take 500 mg by mouth every 6 (six) hours as needed for mild pain or moderate pain.   Marland Kitchen amLODipine (NORVASC) 2.5 MG tablet Take 1 tablet (2.5 mg total) by mouth every morning.  . Flaxseed, Linseed, (BL FLAX SEED OIL PO) Take 1 tablet by mouth daily.   . [DISCONTINUED] cetirizine (ZYRTEC) 10 MG tablet Take 1 tablet (10 mg total) by mouth daily.  . [DISCONTINUED] doxycycline (VIBRA-TABS) 100 MG tablet Take 1 tablet (100 mg total) by mouth 2 (two) times daily.  . [DISCONTINUED] hydrocortisone 2.5 % ointment Apply topically 2 (two) times daily.   No facility-administered encounter medications on file as of 10/03/2014.       Review of Systems     Objective:   Physical Exam  BP 101/62 mmHg  Pulse 66  Temp(Src) 97.2 F (36.2 C) (Oral)  Ht 5\' 2"  (1.575 m)  Wt 121 lb (54.885 kg)  BMI 22.13 kg/m2       Assessment & Plan:

## 2014-10-04 LAB — CMP14+EGFR
ALBUMIN: 4.2 g/dL (ref 3.6–4.8)
ALK PHOS: 56 IU/L (ref 39–117)
ALT: 9 IU/L (ref 0–32)
AST: 17 IU/L (ref 0–40)
Albumin/Globulin Ratio: 1.6 (ref 1.1–2.5)
BILIRUBIN TOTAL: 0.4 mg/dL (ref 0.0–1.2)
BUN/Creatinine Ratio: 14 (ref 11–26)
BUN: 14 mg/dL (ref 8–27)
CALCIUM: 9.6 mg/dL (ref 8.7–10.3)
CHLORIDE: 102 mmol/L (ref 97–108)
CO2: 25 mmol/L (ref 18–29)
Creatinine, Ser: 1.01 mg/dL — ABNORMAL HIGH (ref 0.57–1.00)
GFR calc Af Amer: 68 mL/min/{1.73_m2} (ref >59)
GFR calc non Af Amer: 59 mL/min/{1.73_m2} — ABNORMAL LOW (ref >59)
GLOBULIN, TOTAL: 2.6 g/dL (ref 1.5–4.5)
Glucose: 105 mg/dL — ABNORMAL HIGH (ref 65–99)
POTASSIUM: 4.4 mmol/L (ref 3.5–5.2)
SODIUM: 141 mmol/L (ref 134–144)
TOTAL PROTEIN: 6.8 g/dL (ref 6.0–8.5)

## 2014-11-12 ENCOUNTER — Other Ambulatory Visit: Payer: Self-pay | Admitting: Family Medicine

## 2014-11-12 DIAGNOSIS — Z1231 Encounter for screening mammogram for malignant neoplasm of breast: Secondary | ICD-10-CM

## 2014-12-19 ENCOUNTER — Ambulatory Visit (HOSPITAL_COMMUNITY)
Admission: RE | Admit: 2014-12-19 | Discharge: 2014-12-19 | Disposition: A | Discharge status: Home / Self Care | Payer: 59 | Source: Ambulatory Visit | Attending: Family Medicine | Admitting: Family Medicine

## 2014-12-19 DIAGNOSIS — Z1231 Encounter for screening mammogram for malignant neoplasm of breast: Secondary | ICD-10-CM | POA: Insufficient documentation

## 2015-01-07 ENCOUNTER — Other Ambulatory Visit: Payer: Self-pay | Admitting: Family

## 2015-04-09 ENCOUNTER — Other Ambulatory Visit: Payer: Self-pay | Admitting: Family Medicine

## 2015-05-07 ENCOUNTER — Other Ambulatory Visit: Payer: Self-pay | Admitting: Family Medicine

## 2015-06-10 ENCOUNTER — Other Ambulatory Visit: Payer: Self-pay | Admitting: Family Medicine

## 2015-06-11 NOTE — Telephone Encounter (Signed)
Patient was notified at last refill that she NTBS. Please advise on this refill

## 2015-07-05 ENCOUNTER — Ambulatory Visit (INDEPENDENT_AMBULATORY_CARE_PROVIDER_SITE_OTHER): Payer: BLUE CROSS/BLUE SHIELD | Admitting: Family Medicine

## 2015-07-05 ENCOUNTER — Encounter: Payer: Self-pay | Admitting: Family Medicine

## 2015-07-05 VITALS — BP 140/77 | HR 71 | Temp 97.9°F | Ht 62.0 in | Wt 124.0 lb

## 2015-07-05 DIAGNOSIS — E785 Hyperlipidemia, unspecified: Secondary | ICD-10-CM

## 2015-07-05 DIAGNOSIS — I10 Essential (primary) hypertension: Secondary | ICD-10-CM

## 2015-07-05 DIAGNOSIS — E213 Hyperparathyroidism, unspecified: Secondary | ICD-10-CM | POA: Diagnosis not present

## 2015-07-05 MED ORDER — AMLODIPINE BESYLATE 2.5 MG PO TABS: 2.5000 mg | ORAL_TABLET | Freq: Every morning | ORAL | Status: DC

## 2015-07-05 NOTE — Progress Notes (Signed)
   Subjective:    Patient ID: Kimberly Aguilar, female    DOB: November 10, 1950, 65 y.o.   MRN: [id number]  HPI Pt here for follow up and management of chronic medical problems which includes hypertension and hyperlipidemia. She is taking medications regularly. She occasionally has some aches and pains. She has a son who is totally dependent on her with cerebral palsy. She also has some occasional dizziness that sounds positional. Only medicine is low dose amlodipine.       Patient Active Problem List   Diagnosis Date Noted  . 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 Parathyroid adenoma   . Hypertension   . Hyperparathyroidism ([place]) 06/30/2013  . Personal history of colonic polyps 06/12/2013  . Colon polyp   . 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 Abnormal TSH 03/31/2013  . Hypercalcemia 03/31/2013   Outpatient Encounter Prescriptions as of 07/05/2015  Medication Sig  . acetaminophen (TYLENOL) 500 MG tablet Take 500 mg by mouth every 6 (six) hours as needed for mild pain or moderate pain.   Marland Kitchen amLODipine (NORVASC) 2.5 MG tablet Take 1 tablet (2.5 mg total) by mouth every morning.  . Flaxseed, Linseed, (BL FLAX SEED OIL PO) Take 1 tablet by mouth daily.   . [DISCONTINUED] amLODipine (NORVASC) 2.5 MG tablet TAKE ONE TABLET EVERY MORNING  . [DISCONTINUED] amLODipine (NORVASC) 2.5 MG tablet TAKE ONE TABLET EVERY MORNING   No facility-administered encounter medications on file as of 07/05/2015.      Review of Systems  Constitutional: Negative.   HENT: Negative.   Eyes: Negative.   Respiratory: Negative.   Cardiovascular: Negative.   Gastrointestinal: Negative.   Endocrine: Negative.   Genitourinary: Negative.   Musculoskeletal: Positive for arthralgias.  Skin: Negative.   Allergic/Immunologic: Negative.   Neurological: Positive for dizziness (this morning).  Hematological: Negative.   Psychiatric/Behavioral: Negative.        Objective:   Physical Exam  Constitutional: She  is oriented to person, place, and time. She appears well-developed and well-nourished.  Eyes: Conjunctivae and EOM are normal.  Neck: Normal range of motion. Neck supple.  Cardiovascular: Normal rate, regular rhythm and normal heart sounds.   Pulmonary/Chest: Effort normal and breath sounds normal.  Abdominal: Soft. Bowel sounds are normal.  Musculoskeletal: Normal range of motion.  Neurological: She is alert and oriented to person, place, and time. She has normal reflexes.  Skin: Skin is warm and dry.  Psychiatric: She has a normal mood and affect. Her behavior is normal. Thought content normal.     BP 140/77 mmHg  Pulse 71  Temp(Src) 97.9 F (36.6 C) (Oral)  Ht _0  (1.575 m)  Wt 124 lb (56.246 kg)  BMI 22.67 kg/m2      Assessment & Plan:  1. Essential hypertension Continue amlodipine and monitor pressure  - CMP14+EGFR  2. Hyperparathyroidism ([place]) Parathyroid removed 2 years ago. Calcium has been normal since  3. Hyperlipidemia LDL goal <100 He does not take statin and LDL is less than 100 with a high HDL. - Lipid panel  Wardell Honour MD

## 2015-07-05 NOTE — Patient Instructions (Signed)
Continue current medications. Continue good therapeutic lifestyle changes which include good diet and exercise. Fall precautions discussed with patient. If an FOBT was given today- please return it to our front desk. If you are over 64 years old - you may need Prevnar 13 or the adult Pneumonia vaccine.  **Flu shots are available--- please call and schedule a FLU-CLINIC appointment**  After your visit with us today you will receive a survey in the mail or online from [employer] regarding your care with us. Please take a moment to fill this out. Your feedback is very important to us as you can help us better understand your patient needs as well as improve your experience and satisfaction. WE CARE ABOUT YOU!!!    

## 2015-07-06 LAB — CMP14+EGFR
A/G RATIO: 1.5 (ref 1.2–2.2)
ALT: 10 IU/L (ref 0–32)
AST: 14 IU/L (ref 0–40)
Albumin: 4.3 g/dL (ref 3.6–4.8)
Alkaline Phosphatase: 58 IU/L (ref 39–117)
BILIRUBIN TOTAL: 0.4 mg/dL (ref 0.0–1.2)
BUN/Creatinine Ratio: 19 (ref 11–26)
BUN: 17 mg/dL (ref 8–27)
CO2: 26 mmol/L (ref 18–29)
Calcium: 9.6 mg/dL (ref 8.7–10.3)
Chloride: 101 mmol/L (ref 96–106)
Creatinine, Ser: 0.91 mg/dL (ref 0.57–1.00)
GFR calc Af Amer: 77 mL/min/{1.73_m2} (ref >59)
GFR calc non Af Amer: 67 mL/min/{1.73_m2} (ref >59)
GLUCOSE: 80 mg/dL (ref 65–99)
Globulin, Total: 2.9 g/dL (ref 1.5–4.5)
POTASSIUM: 4.2 mmol/L (ref 3.5–5.2)
Sodium: 142 mmol/L (ref 134–144)
Total Protein: 7.2 g/dL (ref 6.0–8.5)

## 2015-07-06 LAB — LIPID PANEL
CHOLESTEROL TOTAL: 210 mg/dL — AB (ref 100–199)
Chol/HDL Ratio: 2.3 ratio units (ref 0.0–4.4)
HDL: 91 mg/dL (ref >39)
LDL Calculated: 110 mg/dL — ABNORMAL HIGH (ref 0–99)
TRIGLYCERIDES: 44 mg/dL (ref 0–149)
VLDL Cholesterol Cal: 9 mg/dL (ref 5–40)

## 2015-11-25 ENCOUNTER — Other Ambulatory Visit: Payer: Self-pay | Admitting: Family Medicine

## 2015-11-25 DIAGNOSIS — Z1231 Encounter for screening mammogram for malignant neoplasm of breast: Secondary | ICD-10-CM

## 2015-12-27 ENCOUNTER — Other Ambulatory Visit: Payer: Self-pay | Admitting: Family Medicine

## 2015-12-27 ENCOUNTER — Ambulatory Visit (HOSPITAL_COMMUNITY)
Admission: RE | Admit: 2015-12-27 | Discharge: 2015-12-27 | Disposition: A | Discharge status: Home / Self Care | Payer: BLUE CROSS/BLUE SHIELD | Source: Ambulatory Visit | Attending: Family Medicine | Admitting: Family Medicine

## 2015-12-27 DIAGNOSIS — Z1231 Encounter for screening mammogram for malignant neoplasm of breast: Secondary | ICD-10-CM

## 2016-02-14 ENCOUNTER — Other Ambulatory Visit: Payer: Self-pay | Admitting: Family Medicine

## 2016-03-19 ENCOUNTER — Other Ambulatory Visit: Payer: Self-pay | Admitting: Family Medicine

## 2016-04-14 NOTE — Progress Notes (Signed)
   Subjective:    Patient ID: Kimberly Aguilar, female    DOB: 04-Sep-1950, 65 y.o.   MRN: [id number]  HPI 65 year old female here for follow-up hypertension. She is doing well. She has intermittent musculoskeletal pain when she lifts her quadriplegic son without help. This is treated effectively with extra strength Tylenol. She asked about DEXA scan and that was due back in September.  Patient Active Problem List   Diagnosis Date Noted  . Status post parathyroidectomy ([place]) 08/07/2013  . Parathyroid adenoma   . Hypertension   . Hyperparathyroidism ([place]) 06/30/2013  . Personal history of colonic polyps 06/12/2013  . Colon polyp   . 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 Abnormal TSH 03/31/2013  . Hypercalcemia 03/31/2013   Outpatient Encounter Prescriptions as of 04/15/2016  Medication Sig  . acetaminophen (TYLENOL) 500 MG tablet Take 500 mg by mouth every 6 (six) hours as needed for mild pain or moderate pain.   Marland Kitchen amLODipine (NORVASC) 2.5 MG tablet TAKE ONE TABLET EVERY MORNING  . Flaxseed, Linseed, (BL FLAX SEED OIL PO) Take 1 tablet by mouth daily.    No facility-administered encounter medications on file as of 04/15/2016.       Review of Systems  Constitutional: Negative.   HENT: Negative.   Eyes: Negative.   Respiratory: Negative.   Cardiovascular: Negative.   Gastrointestinal: Negative.   Endocrine: Negative.   Genitourinary: Negative.   Musculoskeletal: Positive for back pain.  Hematological: Negative.   Psychiatric/Behavioral: Negative.        Objective:   Physical Exam  Constitutional: She is oriented to person, place, and time. She appears well-developed and well-nourished.  Cardiovascular: Normal rate, regular rhythm and normal heart sounds.   Pulmonary/Chest: Effort normal and breath sounds normal.  Abdominal: Soft.  Musculoskeletal: Normal range of motion.  Neurological: She is alert and oriented to person, place, and time.   BP 138/73   Pulse 63   Temp  97.8 F (36.6 C) (Oral)   Ht '5\' 2"'$  (1.575 m)   Wt 129 lb (58.5 kg)   BMI 23.59 kg/m         Assessment & Plan:  1. Essential hypertension Blood pressure well controlled on single agent - Lipid panel - CMP14+EGFR  2. Hypercalcemia Last DEXA scan was greater than 2 years ago - DG [facility] DEXA  Wardell Honour MD

## 2016-04-15 ENCOUNTER — Encounter: Payer: Self-pay | Admitting: Family Medicine

## 2016-04-15 ENCOUNTER — Ambulatory Visit (INDEPENDENT_AMBULATORY_CARE_PROVIDER_SITE_OTHER): Payer: BLUE CROSS/BLUE SHIELD

## 2016-04-15 ENCOUNTER — Ambulatory Visit (INDEPENDENT_AMBULATORY_CARE_PROVIDER_SITE_OTHER): Payer: BLUE CROSS/BLUE SHIELD | Admitting: Family Medicine

## 2016-04-15 VITALS — BP 138/73 | HR 63 | Temp 97.8°F | Ht 62.0 in | Wt 129.0 lb

## 2016-04-15 DIAGNOSIS — Z780 Asymptomatic menopausal state: Secondary | ICD-10-CM

## 2016-04-15 DIAGNOSIS — I10 Essential (primary) hypertension: Secondary | ICD-10-CM | POA: Diagnosis not present

## 2016-04-16 LAB — LIPID PANEL
CHOLESTEROL TOTAL: 205 mg/dL — AB (ref 100–199)
Chol/HDL Ratio: 2.5 ratio units (ref 0.0–4.4)
HDL: 82 mg/dL (ref >39)
LDL Calculated: 111 mg/dL — ABNORMAL HIGH (ref 0–99)
Triglycerides: 61 mg/dL (ref 0–149)
VLDL Cholesterol Cal: 12 mg/dL (ref 5–40)

## 2016-04-16 LAB — CMP14+EGFR
ALT: 10 IU/L (ref 0–32)
AST: 16 IU/L (ref 0–40)
Albumin/Globulin Ratio: 1.4 (ref 1.2–2.2)
Albumin: 4.2 g/dL (ref 3.6–4.8)
Alkaline Phosphatase: 58 IU/L (ref 39–117)
BILIRUBIN TOTAL: 0.4 mg/dL (ref 0.0–1.2)
BUN/Creatinine Ratio: 18 (ref 12–28)
BUN: 18 mg/dL (ref 8–27)
CHLORIDE: 103 mmol/L (ref 96–106)
CO2: 26 mmol/L (ref 18–29)
Calcium: 9.5 mg/dL (ref 8.7–10.3)
Creatinine, Ser: 1.01 mg/dL — ABNORMAL HIGH (ref 0.57–1.00)
GFR, EST AFRICAN AMERICAN: 68 mL/min/{1.73_m2} (ref >59)
GFR, EST NON AFRICAN AMERICAN: 59 mL/min/{1.73_m2} — AB (ref >59)
Globulin, Total: 2.9 g/dL (ref 1.5–4.5)
Glucose: 90 mg/dL (ref 65–99)
Potassium: 4.3 mmol/L (ref 3.5–5.2)
Sodium: 143 mmol/L (ref 134–144)
TOTAL PROTEIN: 7.1 g/dL (ref 6.0–8.5)

## 2016-04-29 ENCOUNTER — Encounter: Payer: Self-pay | Admitting: Gastroenterology

## 2016-05-19 ENCOUNTER — Encounter: Payer: Self-pay | Admitting: Gastroenterology

## 2016-06-18 ENCOUNTER — Ambulatory Visit: Payer: Medicare HMO | Admitting: *Deleted

## 2016-06-18 VITALS — Ht 62.0 in | Wt 125.2 lb

## 2016-06-18 DIAGNOSIS — Z86010 Personal history of colonic polyps: Secondary | ICD-10-CM

## 2016-06-18 MED ORDER — NA SULFATE-K SULFATE-MG SULF 17.5-3.13-1.6 GM/177ML PO SOLN: 1.0000 | Freq: Once | ORAL | 0 refills | Status: AC

## 2016-06-18 NOTE — Progress Notes (Signed)
Denies allergies to eggs or soy products. Denies complications with sedation or anesthesia. Denies O2 use. Denies use of diet or weight loss medications.  Emmi instructions declined for colonoscopy.  

## 2016-06-23 ENCOUNTER — Encounter: Payer: Self-pay | Admitting: Gastroenterology

## 2016-06-24 ENCOUNTER — Other Ambulatory Visit: Payer: Self-pay | Admitting: Family Medicine

## 2016-07-02 ENCOUNTER — Ambulatory Visit (AMBULATORY_SURGERY_CENTER): Payer: Medicare HMO | Admitting: Gastroenterology

## 2016-07-02 ENCOUNTER — Encounter: Payer: Self-pay | Admitting: Gastroenterology

## 2016-07-02 VITALS — BP 135/67 | HR 60 | Temp 97.1°F | Resp 16 | Ht 62.0 in | Wt 125.0 lb

## 2016-07-02 DIAGNOSIS — I10 Essential (primary) hypertension: Secondary | ICD-10-CM | POA: Diagnosis not present

## 2016-07-02 DIAGNOSIS — Z86010 Personal history of colonic polyps: Secondary | ICD-10-CM | POA: Diagnosis not present

## 2016-07-02 MED ORDER — SODIUM CHLORIDE 0.9 % IV SOLN: 500.0000 mL | INTRAVENOUS | Status: DC

## 2016-07-02 NOTE — Progress Notes (Signed)
Report given to PACU, vss 

## 2016-07-02 NOTE — Op Note (Signed)
[facility] Patient Name: Kimberly Aguilar Procedure Date: 07/02/2016 12:05 PM MRN: [id number] Endoscopist: Mauri Pole , MD Age: 66 Referring MD:  Date of Birth: 1950-12-26 Gender: Female Account #: [id number] Procedure:                Colonoscopy Indications:              High risk colon cancer surveillance: Personal                            history of adenomatous polyps with villous                            component (10 mm or greater in size) on last                            colonoscopy 3 years ago, Last colonoscopy: January                            2015 Medicines:                Monitored Anesthesia Care Procedure:                Pre-Anesthesia Assessment:                           - Prior to the procedure, a History and Physical                            was performed, and patient medications and                            allergies were reviewed. The patient's tolerance of                            previous anesthesia was also reviewed. The risks                            and benefits of the procedure and the sedation                            options and risks were discussed with the patient.                            All questions were answered, and informed consent                            was obtained. Prior Anticoagulants: The patient has                            taken no previous anticoagulant or antiplatelet                            agents. ASA Grade Assessment: II - A patient with  mild systemic disease. After reviewing the risks                            and benefits, the patient was deemed in                            satisfactory condition to undergo the procedure.                           After obtaining informed consent, the colonoscope                            was passed under direct vision. Throughout the                            procedure, the patient's blood pressure, pulse, and             oxygen saturations were monitored continuously. The                            Colonoscope was introduced through the anus and                            advanced to the the terminal ileum, with                            identification of the appendiceal orifice and IC                            valve. The colonoscopy was performed without                            difficulty. The patient tolerated the procedure                            well. The quality of the bowel preparation was                            excellent. The terminal ileum, ileocecal valve,                            appendiceal orifice, and rectum were photographed. Scope In: 12:16:38 PM Scope Out: 12:29:01 PM Scope Withdrawal Time: 0 hours 7 minutes 39 seconds  Total Procedure Duration: 0 hours 12 minutes 23 seconds  Findings:                 The perianal and digital rectal examinations were                            normal.                           The entire examined colon appeared normal on direct  and retroflexion views. Complications:            No immediate complications. Estimated Blood Loss:     Estimated blood loss: none. Impression:               - The entire examined colon is normal on direct and                            retroflexion views.                           - No specimens collected. Recommendation:           - Patient has a contact number available for                            emergencies. The signs and symptoms of potential                            delayed complications were discussed with the                            patient. Return to normal activities tomorrow.                            Written discharge instructions were provided to the                            patient.                           - Resume previous diet.                           - Continue present medications.                           - Await pathology results.                            - Repeat colonoscopy in 5 years for surveillance. Mauri Pole, MD 07/02/2016 12:41:14 PM This report has been signed electronically.

## 2016-07-02 NOTE — Patient Instructions (Signed)
Normal colon. Repeat colonoscopy in 5 years. Resume current medications. Call us with any questions or concerns. Thank you!   YOU HAD AN ENDOSCOPIC PROCEDURE TODAY AT [facility]:   Refer to the procedure report that was given to you for any specific questions about what was found during the examination.  If the procedure report does not answer your questions, please call your gastroenterologist to clarify.  If you requested that your care partner not be given the details of your procedure findings, then the procedure report has been included in a sealed envelope for you to review at your convenience later.  YOU SHOULD EXPECT: Some feelings of bloating in the abdomen. Passage of more gas than usual.  Walking can help get rid of the air that was put into your GI tract during the procedure and reduce the bloating. If you had a lower endoscopy (such as a colonoscopy or flexible sigmoidoscopy) you may notice spotting of blood in your stool or on the toilet paper. If you underwent a bowel prep for your procedure, you may not have a normal bowel movement for a few days.  Please Note:  You might notice some irritation and congestion in your nose or some drainage.  This is from the oxygen used during your procedure.  There is no need for concern and it should clear up in a day or so.  SYMPTOMS TO REPORT IMMEDIATELY:   Following lower endoscopy (colonoscopy or flexible sigmoidoscopy):  Excessive amounts of blood in the stool  Significant tenderness or worsening of abdominal pains  Swelling of the abdomen that is new, acute  Fever of 100F or higher   For urgent or emergent issues, a gastroenterologist can be reached at any hour by calling [phone].   DIET:  We do recommend a small meal at first, but then you may proceed to your regular diet.  Drink plenty of fluids but you should avoid alcoholic beverages for 24 hours.  ACTIVITY:  You should plan to take it easy for the rest of  today and you should NOT DRIVE or use heavy machinery until tomorrow (because of the sedation medicines used during the test).    FOLLOW UP: Our staff will call the number listed on your records the next business day following your procedure to check on you and address any questions or concerns that you may have regarding the information given to you following your procedure. If we do not reach you, we will leave a message.  However, if you are feeling well and you are not experiencing any problems, there is no need to return our call.  We will assume that you have returned to your regular daily activities without incident.  If any biopsies were taken you will be contacted by phone or by letter within the next 1-3 weeks.  Please call us at [phone] if you have not heard about the biopsies in 3 weeks.    SIGNATURES/CONFIDENTIALITY: You and/or your care partner have signed paperwork which will be entered into your electronic medical record.  These signatures attest to the fact that that the information above on your After Visit Summary has been reviewed and is understood.  Full responsibility of the confidentiality of this discharge information lies with you and/or your care-partner.

## 2016-07-03 ENCOUNTER — Telehealth: Payer: Self-pay

## 2016-07-03 NOTE — Telephone Encounter (Signed)
Unable to leave message . No number was obtained to leave message.

## 2016-07-06 ENCOUNTER — Telehealth: Payer: Self-pay | Admitting: *Deleted

## 2016-07-06 NOTE — Telephone Encounter (Signed)
  Follow up Call-  Call back number 07/02/2016  Post procedure Call Back phone  # [phone]  Permission to leave phone message Yes  Some recent data might be hidden     Patient questions:  Do you have a fever, pain , or abdominal swelling? No. Pain Score  0 *  Have you tolerated food without any problems? Yes.    Have you been able to return to your normal activities? Yes.    Do you have any questions about your discharge instructions: Diet   No. Medications  No. Follow up visit  No.  Do you have questions or concerns about your Care? No.  Actions: * If pain score is 4 or above: No action needed, pain <4.

## 2016-07-07 ENCOUNTER — Encounter: Payer: Self-pay | Admitting: Family Medicine

## 2016-07-07 ENCOUNTER — Ambulatory Visit (INDEPENDENT_AMBULATORY_CARE_PROVIDER_SITE_OTHER): Payer: Medicare HMO | Admitting: Family Medicine

## 2016-07-07 VITALS — BP 121/61 | HR 67 | Temp 98.0°F | Ht 62.0 in | Wt 126.0 lb

## 2016-07-07 DIAGNOSIS — I10 Essential (primary) hypertension: Secondary | ICD-10-CM

## 2016-07-07 NOTE — Progress Notes (Signed)
   Subjective:    Patient ID: Kimberly Aguilar, female    DOB: 1951/01/24, 66 y.o.   MRN: [id number]  HPI 66 year old female who is followed for blood pressure. Blood pressures have been well controlled on low-dose amlodipine. She is up-to-date on bone density Pap smears and mammograms she had colonoscopy last week which was reported to be normal. Lipids are in good shape with high HDL of 82. There've been no issues with blood sugar or thyroid.  Patient Active Problem List   Diagnosis Date Noted  . Status post parathyroidectomy ([place]) 08/07/2013  . Parathyroid adenoma   . Hypertension   . Hyperparathyroidism ([place]) 06/30/2013  . Personal history of colonic polyps 06/12/2013  . Colon polyp   . 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 Abnormal TSH 03/31/2013  . Hypercalcemia 03/31/2013   Outpatient Encounter Prescriptions as of 07/07/2016  Medication Sig  . acetaminophen (TYLENOL) 500 MG tablet Take 500 mg by mouth every 6 (six) hours as needed for mild pain or moderate pain.   Marland Kitchen amLODipine (NORVASC) 2.5 MG tablet TAKE ONE TABLET EVERY MORNING  . Flaxseed, Linseed, (BL FLAX SEED OIL PO) Take 1 tablet by mouth daily.    Facility-Administered Encounter Medications as of 07/07/2016  Medication  . 0.9 %  sodium chloride infusion      Review of Systems  Constitutional: Negative.   HENT: Negative.   Eyes: Negative.   Respiratory: Negative.   Cardiovascular: Negative.   Gastrointestinal: Negative.   Endocrine: Negative.   Genitourinary: Negative.   Hematological: Negative.   Psychiatric/Behavioral: Negative.        Objective:   Physical Exam  Constitutional: She is oriented to person, place, and time. She appears well-developed and well-nourished.  Eyes: Conjunctivae and EOM are normal.  Neck: Normal range of motion. Neck supple.  Cardiovascular: Normal rate, regular rhythm and normal heart sounds.   Pulmonary/Chest: Effort normal and breath sounds normal.  Abdominal: Soft. Bowel  sounds are normal.  Musculoskeletal: Normal range of motion.  Neurological: She is alert and oriented to person, place, and time. She has normal reflexes.  Skin: Skin is warm and dry.  Psychiatric: She has a normal mood and affect. Her behavior is normal. Thought content normal.   BP 121/61   Pulse 67   Temp 98 F (36.7 C) (Oral)   Ht 5\' 2"  (1.575 m)   Wt 126 lb (57.2 kg)   BMI 23.05 kg/m         Assessment & Plan:  1. Essential hypertension Continue amlodipine at 2.5 mg. Patient follow-up in 6 months  Wardell Honour MD

## 2016-07-17 ENCOUNTER — Other Ambulatory Visit: Payer: Self-pay | Admitting: Family Medicine

## 2016-10-16 ENCOUNTER — Encounter: Payer: Self-pay | Admitting: Family Medicine

## 2016-10-16 ENCOUNTER — Ambulatory Visit (INDEPENDENT_AMBULATORY_CARE_PROVIDER_SITE_OTHER): Payer: Medicare HMO | Admitting: Family Medicine

## 2016-10-16 VITALS — BP 106/63 | HR 69 | Temp 99.2°F | Ht 62.0 in | Wt 123.8 lb

## 2016-10-16 DIAGNOSIS — I10 Essential (primary) hypertension: Secondary | ICD-10-CM

## 2016-10-16 NOTE — Patient Instructions (Addendum)
Great to see you!  Consider getting a pap smear  Consider the pneumonia vaccine, prevnar  Come back in 6 months

## 2016-10-16 NOTE — Progress Notes (Signed)
   HPI  Patient presents today here for follow-up chronic medical conditions. Hypertension Good medication compliance No chest pain, dyspnea, palpitations, leg edema. Patient watches her salt intake closely, she is also very active walking frequently and going to resume classes.  Patient is considering getting a Pap smear.  PMH: Smoking status noted ROS: Per HPI  Objective: BP 106/63   Pulse 69   Temp 99.2 F (37.3 C) (Oral)   Ht 5\' 2"  (1.575 m)   Wt 123 lb 12.8 oz (56.2 kg)   BMI 22.64 kg/m  Gen: NAD, alert, cooperative with exam HEENT: NCAT CV: RRR, good S1/S2, no murmur Resp: CTABL, no wheezes, non-labored Ext: No edema, warm Neuro: Alert and oriented, No gross deficits  Assessment and plan:  # Hypertension Well-controlled on amlodipine, no changes Labs are up-to-date Plan to repeat labs in 6 months    Laroy Apple, MD [facility] 10/16/2016, 3:32 PM

## 2016-11-23 ENCOUNTER — Other Ambulatory Visit: Payer: Self-pay | Admitting: Family Medicine

## 2016-11-23 DIAGNOSIS — Z1231 Encounter for screening mammogram for malignant neoplasm of breast: Secondary | ICD-10-CM

## 2016-12-28 ENCOUNTER — Ambulatory Visit (HOSPITAL_COMMUNITY)
Admission: RE | Admit: 2016-12-28 | Discharge: 2016-12-28 | Disposition: A | Discharge status: Home / Self Care | Payer: Medicare HMO | Source: Ambulatory Visit | Attending: Family Medicine | Admitting: Family Medicine

## 2016-12-28 DIAGNOSIS — Z1231 Encounter for screening mammogram for malignant neoplasm of breast: Secondary | ICD-10-CM | POA: Diagnosis not present

## 2017-01-27 ENCOUNTER — Other Ambulatory Visit: Payer: Self-pay | Admitting: Family Medicine

## 2017-03-25 ENCOUNTER — Ambulatory Visit: Payer: Medicare HMO | Admitting: Family Medicine

## 2017-04-05 ENCOUNTER — Encounter: Payer: Self-pay | Admitting: Family Medicine

## 2017-04-05 ENCOUNTER — Ambulatory Visit (INDEPENDENT_AMBULATORY_CARE_PROVIDER_SITE_OTHER): Payer: Medicare HMO | Admitting: Family Medicine

## 2017-04-05 VITALS — BP 124/79 | HR 79 | Temp 98.5°F | Ht 62.0 in | Wt 124.2 lb

## 2017-04-05 DIAGNOSIS — I10 Essential (primary) hypertension: Secondary | ICD-10-CM

## 2017-04-05 DIAGNOSIS — Z0000 Encounter for general adult medical examination without abnormal findings: Secondary | ICD-10-CM

## 2017-04-05 MED ORDER — AMLODIPINE BESYLATE 2.5 MG PO TABS: 2.5000 mg | ORAL_TABLET | Freq: Every morning | ORAL | 3 refills | Status: DC

## 2017-04-05 NOTE — Patient Instructions (Signed)
Great to see you!  Consider the vaccination for pneumonia prevnar. This is typically given at 65 y/o and pneumovax ( a second vaccines) is given at 55.  Health Maintenance, Female Adopting a healthy lifestyle and getting preventive care can go a long way to promote health and wellness. Talk with your health care provider about what schedule of regular examinations is right for you. This is a good chance for you to check in with your provider about disease prevention and staying healthy. In between checkups, there are plenty of things you can do on your own. Experts have done a lot of research about which lifestyle changes and preventive measures are most likely to keep you healthy. Ask your health care provider for more information. Weight and diet Eat a healthy diet  Be sure to include plenty of vegetables, fruits, low-fat dairy products, and lean protein.  Do not eat a lot of foods high in solid fats, added sugars, or salt.  Get regular exercise. This is one of the most important things you can do for your health. ? Most adults should exercise for at least 150 minutes each week. The exercise should increase your heart rate and make you sweat (moderate-intensity exercise). ? Most adults should also do strengthening exercises at least twice a week. This is in addition to the moderate-intensity exercise.  Maintain a healthy weight  Body mass index (BMI) is a measurement that can be used to identify possible weight problems. It estimates body fat based on height and weight. Your health care provider can help determine your BMI and help you achieve or maintain a healthy weight.  For females 46 years of age and older: ? A BMI below 18.5 is considered underweight. ? A BMI of 18.5 to 24.9 is normal. ? A BMI of 25 to 29.9 is considered overweight. ? A BMI of 30 and above is considered obese.  Watch levels of cholesterol and blood lipids  You should start having your blood tested for lipids and  cholesterol at 65 years of age, then have this test every 5 years.  You may need to have your cholesterol levels checked more often if: ? Your lipid or cholesterol levels are high. ? You are older than 65 years of age. ? You are at high risk for heart disease.  Cancer screening Lung Cancer  Lung cancer screening is recommended for adults 74-24 years old who are at high risk for lung cancer because of a history of smoking.  A yearly low-dose CT scan of the lungs is recommended for people who: ? Currently smoke. ? Have quit within the past 15 years. ? Have at least a 30-pack-year history of smoking. A pack year is smoking an average of one pack of cigarettes a day for 1 year.  Yearly screening should continue until it has been 15 years since you quit.  Yearly screening should stop if you develop a health problem that would prevent you from having lung cancer treatment.  Breast Cancer  Practice breast self-awareness. This means understanding how your breasts normally appear and feel.  It also means doing regular breast self-exams. Let your health care provider know about any changes, no matter how small.  If you are in your 20s or 30s, you should have a clinical breast exam (CBE) by a health care provider every 1-3 years as part of a regular health exam.  If you are 54 or older, have a CBE every year. Also consider having a breast X-ray (  mammogram) every year.  If you have a family history of breast cancer, talk to your health care provider about genetic screening.  If you are at high risk for breast cancer, talk to your health care provider about having an MRI and a mammogram every year.  Breast cancer gene (BRCA) assessment is recommended for women who have family members with BRCA-related cancers. BRCA-related cancers include: ? Breast. ? Ovarian. ? Tubal. ? Peritoneal cancers.  Results of the assessment will determine the need for genetic counseling and BRCA1 and BRCA2  testing.  Cervical Cancer Your health care provider may recommend that you be screened regularly for cancer of the pelvic organs (ovaries, uterus, and vagina). This screening involves a pelvic examination, including checking for microscopic changes to the surface of your cervix (Pap test). You may be encouraged to have this screening done every 3 years, beginning at age 63.  For women ages 32-65, health care providers may recommend pelvic exams and Pap testing every 3 years, or they may recommend the Pap and pelvic exam, combined with testing for human papilloma virus (HPV), every 5 years. Some types of HPV increase your risk of cervical cancer. Testing for HPV may also be done on women of any age with unclear Pap test results.  Other health care providers may not recommend any screening for nonpregnant women who are considered low risk for pelvic cancer and who do not have symptoms. Ask your health care provider if a screening pelvic exam is right for you.  If you have had past treatment for cervical cancer or a condition that could lead to cancer, you need Pap tests and screening for cancer for at least 20 years after your treatment. If Pap tests have been discontinued, your risk factors (such as having a new sexual partner) need to be reassessed to determine if screening should resume. Some women have medical problems that increase the chance of getting cervical cancer. In these cases, your health care provider may recommend more frequent screening and Pap tests.  Colorectal Cancer  This type of cancer can be detected and often prevented.  Routine colorectal cancer screening usually begins at 65 years of age and continues through 65 years of age.  Your health care provider may recommend screening at an earlier age if you have risk factors for colon cancer.  Your health care provider may also recommend using home test kits to check for hidden blood in the stool.  A small camera at the end of a  tube can be used to examine your colon directly (sigmoidoscopy or colonoscopy). This is done to check for the earliest forms of colorectal cancer.  Routine screening usually begins at age 80.  Direct examination of the colon should be repeated every 5-10 years through 65 years of age. However, you may need to be screened more often if early forms of precancerous polyps or small growths are found.  Skin Cancer  Check your skin from head to toe regularly.  Tell your health care provider about any new moles or changes in moles, especially if there is a change in a mole's shape or color.  Also tell your health care provider if you have a mole that is larger than the size of a pencil eraser.  Always use sunscreen. Apply sunscreen liberally and repeatedly throughout the day.  Protect yourself by wearing long sleeves, pants, a wide-brimmed hat, and sunglasses whenever you are outside.  Heart disease, diabetes, and high blood pressure  High blood pressure  causes heart disease and increases the risk of stroke. High blood pressure is more likely to develop in: ? People who have blood pressure in the high end of the normal range (130-139/85-89 mm Hg). ? People who are overweight or obese. ? People who are African American.  If you are 84-42 years of age, have your blood pressure checked every 3-5 years. If you are 50 years of age or older, have your blood pressure checked every year. You should have your blood pressure measured twice-once when you are at a hospital or clinic, and once when you are not at a hospital or clinic. Record the average of the two measurements. To check your blood pressure when you are not at a hospital or clinic, you can use: ? An automated blood pressure machine at a pharmacy. ? A home blood pressure monitor.  If you are between 7 years and 63 years old, ask your health care provider if you should take aspirin to prevent strokes.  Have regular diabetes screenings. This  involves taking a blood sample to check your fasting blood sugar level. ? If you are at a normal weight and have a low risk for diabetes, have this test once every three years after 65 years of age. ? If you are overweight and have a high risk for diabetes, consider being tested at a younger age or more often. Preventing infection Hepatitis B  If you have a higher risk for hepatitis B, you should be screened for this virus. You are considered at high risk for hepatitis B if: ? You were born in a country where hepatitis B is common. Ask your health care provider which countries are considered high risk. ? Your parents were born in a high-risk country, and you have not been immunized against hepatitis B (hepatitis B vaccine). ? You have HIV or AIDS. ? You use needles to inject street drugs. ? You live with someone who has hepatitis B. ? You have had sex with someone who has hepatitis B. ? You get hemodialysis treatment. ? You take certain medicines for conditions, including cancer, organ transplantation, and autoimmune conditions.  Hepatitis C  Blood testing is recommended for: ? Everyone born from [birth year] through 1964. ? Anyone with known risk factors for hepatitis C.  Sexually transmitted infections (STIs)  You should be screened for sexually transmitted infections (STIs) including gonorrhea and chlamydia if: ? You are sexually active and are younger than 65 years of age. ? You are older than 65 years of age and your health care provider tells you that you are at risk for this type of infection. ? Your sexual activity has changed since you were last screened and you are at an increased risk for chlamydia or gonorrhea. Ask your health care provider if you are at risk.  If you do not have HIV, but are at risk, it may be recommended that you take a prescription medicine daily to prevent HIV infection. This is called pre-exposure prophylaxis (PrEP). You are considered at risk if: ? You are  sexually active and do not regularly use condoms or know the HIV status of your partner(s). ? You take drugs by injection. ? You are sexually active with a partner who has HIV.  Talk with your health care provider about whether you are at high risk of being infected with HIV. If you choose to begin PrEP, you should first be tested for HIV. You should then be tested every 3 months for as long  as you are taking PrEP. Pregnancy  If you are premenopausal and you may become pregnant, ask your health care provider about preconception counseling.  If you may become pregnant, take 400 to 800 micrograms (mcg) of folic acid every day.  If you want to prevent pregnancy, talk to your health care provider about birth control (contraception). Osteoporosis and menopause  Osteoporosis is a disease in which the bones lose minerals and strength with aging. This can result in serious bone fractures. Your risk for osteoporosis can be identified using a bone density scan.  If you are 32 years of age or older, or if you are at risk for osteoporosis and fractures, ask your health care provider if you should be screened.  Ask your health care provider whether you should take a calcium or vitamin D supplement to lower your risk for osteoporosis.  Menopause may have certain physical symptoms and risks.  Hormone replacement therapy may reduce some of these symptoms and risks. Talk to your health care provider about whether hormone replacement therapy is right for you. Follow these instructions at home:  Schedule regular health, dental, and eye exams.  Stay current with your immunizations.  Do not use any tobacco products including cigarettes, chewing tobacco, or electronic cigarettes.  If you are pregnant, do not drink alcohol.  If you are breastfeeding, limit how much and how often you drink alcohol.  Limit alcohol intake to no more than 1 drink per day for nonpregnant women. One drink equals 12 ounces of  beer, 5 ounces of wine, or 1 ounces of hard liquor.  Do not use street drugs.  Do not share needles.  Ask your health care provider for help if you need support or information about quitting drugs.  Tell your health care provider if you often feel depressed.  Tell your health care provider if you have ever been abused or do not feel safe at home. This information is not intended to replace advice given to you by your health care provider. Make sure you discuss any questions you have with your health care provider. Document Released: 10/20/2010 Document Revised: 09/12/2015 Document Reviewed: 01/08/2015 Elsevier Interactive Patient Education  [employer].

## 2017-04-05 NOTE — Progress Notes (Signed)
   HPI  Patient presents today here for an annual physical exam and to follow-up for chronic medical conditions.  Patient feels well, she has no complaints. She has had some increase in nasal congestion with seasonal allergies lately.  She has not try medication except for Hall's lozenges which have helped.  She has not had a Pap smear in years, she is not very interested. She turned 67 next month. She is not interested in Prevnar today but will consider. She is up-to-date on her mammogram.  She watches her diet moderately.  PMH: Smoking status noted ROS: Per HPI  Objective: BP 124/79   Pulse 79   Temp 98.5 F (36.9 C) (Oral)   Ht '5\' 2"'$  (1.575 m)   Wt 124 lb 3.2 oz (56.3 kg)   BMI 22.72 kg/m  Gen: NAD, alert, cooperative with exam HEENT: NCAT, EOMI, PERRL, arcus senilis bilaterally, oropharynx moist and clear CV: RRR, good S1/S2, no murmur Resp: CTABL, no wheezes, non-labored Abd: SNTND, BS present, no guarding or organomegaly Ext: No edema, warm  Neuro: Alert and oriented, strength 5/5 in bilateral lower extremities  Assessment and plan:  #Annual physical exam Normal exam, discussed vaccines at length, she will consider Prevnar. Discussed usual Pap smear guidelines, these will not be required after her birthday  #Hypertension Well-controlled on amlodipine, refilled.    Orders Placed This Encounter  Procedures  . Lipid panel  . CMP14+EGFR  . CBC with Differential/Platelet  . TSH    Meds ordered this encounter  Medications  . amLODipine (NORVASC) 2.5 MG tablet    Sig: Take 1 tablet (2.5 mg total) by mouth every morning.    Dispense:  90 tablet    Refill:  [place], MD [facility] 04/05/2017, 11:31 AM

## 2017-04-06 ENCOUNTER — Encounter: Payer: Self-pay | Admitting: Family Medicine

## 2017-04-06 LAB — CMP14+EGFR
ALBUMIN: 4.2 g/dL (ref 3.6–4.8)
ALK PHOS: 63 IU/L (ref 39–117)
ALT: 10 IU/L (ref 0–32)
AST: 15 IU/L (ref 0–40)
Albumin/Globulin Ratio: 1.7 (ref 1.2–2.2)
BILIRUBIN TOTAL: 0.4 mg/dL (ref 0.0–1.2)
BUN / CREAT RATIO: 16 (ref 12–28)
BUN: 16 mg/dL (ref 8–27)
CO2: 26 mmol/L (ref 20–29)
Calcium: 9.4 mg/dL (ref 8.7–10.3)
Chloride: 105 mmol/L (ref 96–106)
Creatinine, Ser: 1 mg/dL (ref 0.57–1.00)
GFR calc Af Amer: 68 mL/min/{1.73_m2} (ref >59)
GFR calc non Af Amer: 59 mL/min/{1.73_m2} — ABNORMAL LOW (ref >59)
GLOBULIN, TOTAL: 2.5 g/dL (ref 1.5–4.5)
Glucose: 95 mg/dL (ref 65–99)
POTASSIUM: 4.4 mmol/L (ref 3.5–5.2)
SODIUM: 143 mmol/L (ref 134–144)
Total Protein: 6.7 g/dL (ref 6.0–8.5)

## 2017-04-06 LAB — CBC WITH DIFFERENTIAL/PLATELET
BASOS ABS: 0.1 10*3/uL (ref 0.0–0.2)
Basos: 1 %
EOS (ABSOLUTE): 0.4 10*3/uL (ref 0.0–0.4)
Eos: 9 %
Hematocrit: 38.1 % (ref 34.0–46.6)
Hemoglobin: 12.2 g/dL (ref 11.1–15.9)
IMMATURE GRANS (ABS): 0 10*3/uL (ref 0.0–0.1)
Immature Granulocytes: 0 %
LYMPHS: 43 %
Lymphocytes Absolute: 2 10*3/uL (ref 0.7–3.1)
MCH: 29.8 pg (ref 26.6–33.0)
MCHC: 32 g/dL (ref 31.5–35.7)
MCV: 93 fL (ref 79–97)
Monocytes Absolute: 0.3 10*3/uL (ref 0.1–0.9)
Monocytes: 7 %
NEUTROS ABS: 1.9 10*3/uL (ref 1.4–7.0)
Neutrophils: 40 %
PLATELETS: 266 10*3/uL (ref 150–379)
RBC: 4.1 x10E6/uL (ref 3.77–5.28)
RDW: 13.5 % (ref 12.3–15.4)
WBC: 4.7 10*3/uL (ref 3.4–10.8)

## 2017-04-06 LAB — LIPID PANEL
CHOLESTEROL TOTAL: 200 mg/dL — AB (ref 100–199)
Chol/HDL Ratio: 2.4 ratio (ref 0.0–4.4)
HDL: 83 mg/dL (ref >39)
LDL CALC: 107 mg/dL — AB (ref 0–99)
Triglycerides: 51 mg/dL (ref 0–149)
VLDL CHOLESTEROL CAL: 10 mg/dL (ref 5–40)

## 2017-04-06 LAB — TSH: TSH: 3.45 u[IU]/mL (ref 0.450–4.500)

## 2017-07-08 DIAGNOSIS — H524 Presbyopia: Secondary | ICD-10-CM | POA: Diagnosis not present

## 2017-07-08 DIAGNOSIS — H43813 Vitreous degeneration, bilateral: Secondary | ICD-10-CM | POA: Diagnosis not present

## 2017-07-08 DIAGNOSIS — H40013 Open angle with borderline findings, low risk, bilateral: Secondary | ICD-10-CM | POA: Diagnosis not present

## 2017-07-08 DIAGNOSIS — H3562 Retinal hemorrhage, left eye: Secondary | ICD-10-CM | POA: Diagnosis not present

## 2017-07-30 ENCOUNTER — Encounter: Payer: Self-pay | Admitting: Family Medicine

## 2017-07-30 ENCOUNTER — Ambulatory Visit (INDEPENDENT_AMBULATORY_CARE_PROVIDER_SITE_OTHER): Payer: Medicare HMO | Admitting: Family Medicine

## 2017-07-30 ENCOUNTER — Ambulatory Visit (INDEPENDENT_AMBULATORY_CARE_PROVIDER_SITE_OTHER): Payer: Medicare HMO

## 2017-07-30 VITALS — BP 129/79 | HR 74 | Temp 98.4°F | Ht 62.0 in | Wt 125.0 lb

## 2017-07-30 DIAGNOSIS — R05 Cough: Secondary | ICD-10-CM

## 2017-07-30 DIAGNOSIS — I10 Essential (primary) hypertension: Secondary | ICD-10-CM

## 2017-07-30 DIAGNOSIS — R059 Cough, unspecified: Secondary | ICD-10-CM

## 2017-07-30 DIAGNOSIS — R0982 Postnasal drip: Secondary | ICD-10-CM | POA: Diagnosis not present

## 2017-07-30 DIAGNOSIS — Z8639 Personal history of other endocrine, nutritional and metabolic disease: Secondary | ICD-10-CM | POA: Diagnosis not present

## 2017-07-30 DIAGNOSIS — M25532 Pain in left wrist: Secondary | ICD-10-CM

## 2017-07-30 MED ORDER — FLUTICASONE PROPIONATE 50 MCG/ACT NA SUSP: 2.0000 | Freq: Every day | NASAL | 6 refills | Status: DC

## 2017-07-30 MED ORDER — AMLODIPINE BESYLATE 2.5 MG PO TABS: 2.5000 mg | ORAL_TABLET | Freq: Every morning | ORAL | 3 refills | Status: DC

## 2017-07-30 NOTE — Patient Instructions (Signed)
Great to see you!  Continue zyrtec, consider starting flonase 2 sprays per nostril daily.

## 2017-07-30 NOTE — Progress Notes (Signed)
   HPI  Patient presents today follow-up chronic medical conditions.  Hypertension Good medication tolerance. Good medication compliance. Needs refill of amlodipine.  Left wrist pain Was bothering her more previously when she was doing more activities, she had a small swelling on the radial portion of her medial wrist.  She states that now she is not having a limitations of activities.  She also is having some cough and wheezing with exercise. She states that she has improved with using Zyrtec, she is clearing her throat frequently in the morning, she has had one episode of clearing her throat with blood-tinged sputum.  Patient also has history of parathyroid adenoma, this was surgically resected, she is interested in following up labs.   PMH: Smoking status noted ROS: Per HPI  Objective: BP 129/79 (BP Location: Right Arm)   Pulse 74   Temp 98.4 F (36.9 C) (Oral)   Ht _0  (1.575 m)   Wt 125 lb (56.7 kg)   BMI 22.86 kg/m  Gen: NAD, alert, cooperative with exam HEENT: NCAT, nares with swollen boggy mucosa and enlarged turbinates bilaterally CV: RRR, good S1/S2, no murmur Resp: CTABL, no wheezes, non-labored Ext: No edema, warm Neuro: Alert and oriented, No gross deficits MSK No palpable abnormality of the left wrist  Assessment and plan:  #Hypertension Well-controlled, continue amlodipine Labs  #History of hyperparathyroidism Repeat labs including PTH  #Postnasal drip, cough Likely just postnasal drip Adding Flonase, continue Zyrtec Plain film per her request  #Left wrist pain Possible ganglion cyst, she has swelling and tenderness at that time Improved now, reassurance provided, refer to hand if needed    Orders Placed This Encounter  Procedures  . DG Chest 2 View    Standing Status:   Future    Number of Occurrences:   1    Standing Expiration Date:   09/30/2018    Order Specific Question:   Reason for Exam (SYMPTOM  OR DIAGNOSIS REQUIRED)   Answer:   cough    Order Specific Question:   Preferred imaging location?    Answer:   Internal    Order Specific Question:   Radiology Contrast Protocol - do NOT remove file path    Answer:   [email or web address]  . CMP14+EGFR  . CBC with Differential/Platelet  . PTH, Intact and Calcium    Meds ordered this encounter  Medications  . fluticasone (FLONASE) 50 MCG/ACT nasal spray    Sig: Place 2 sprays into both nostrils daily.    Dispense:  16 g    Refill:  6  . amLODipine (NORVASC) 2.5 MG tablet    Sig: Take 1 tablet (2.5 mg total) by mouth every morning.    Dispense:  90 tablet    Refill:  [place], MD [facility] 07/30/2017, 10:08 AM

## 2017-07-31 LAB — CBC WITH DIFFERENTIAL/PLATELET
BASOS: 2 %
Basophils Absolute: 0.1 10*3/uL (ref 0.0–0.2)
EOS (ABSOLUTE): 0.7 10*3/uL — AB (ref 0.0–0.4)
EOS: 16 %
HEMATOCRIT: 38.8 % (ref 34.0–46.6)
HEMOGLOBIN: 12.8 g/dL (ref 11.1–15.9)
IMMATURE GRANS (ABS): 0 10*3/uL (ref 0.0–0.1)
Immature Granulocytes: 0 %
LYMPHS: 50 %
Lymphocytes Absolute: 2.1 10*3/uL (ref 0.7–3.1)
MCH: 30.3 pg (ref 26.6–33.0)
MCHC: 33 g/dL (ref 31.5–35.7)
MCV: 92 fL (ref 79–97)
Monocytes Absolute: 0.3 10*3/uL (ref 0.1–0.9)
Monocytes: 8 %
NEUTROS ABS: 1 10*3/uL — AB (ref 1.4–7.0)
Neutrophils: 24 %
Platelets: 272 10*3/uL (ref 150–379)
RBC: 4.22 x10E6/uL (ref 3.77–5.28)
RDW: 13.3 % (ref 12.3–15.4)
WBC: 4.1 10*3/uL (ref 3.4–10.8)

## 2017-07-31 LAB — CMP14+EGFR
A/G RATIO: 1.6 (ref 1.2–2.2)
ALT: 12 IU/L (ref 0–32)
AST: 17 IU/L (ref 0–40)
Albumin: 4.2 g/dL (ref 3.6–4.8)
Alkaline Phosphatase: 68 IU/L (ref 39–117)
BUN/Creatinine Ratio: 15 (ref 12–28)
BUN: 16 mg/dL (ref 8–27)
Bilirubin Total: 0.3 mg/dL (ref 0.0–1.2)
CO2: 26 mmol/L (ref 20–29)
CREATININE: 1.05 mg/dL — AB (ref 0.57–1.00)
Calcium: 9.5 mg/dL (ref 8.7–10.3)
Chloride: 103 mmol/L (ref 96–106)
GFR, EST AFRICAN AMERICAN: 64 mL/min/{1.73_m2} (ref >59)
GFR, EST NON AFRICAN AMERICAN: 55 mL/min/{1.73_m2} — AB (ref >59)
GLOBULIN, TOTAL: 2.7 g/dL (ref 1.5–4.5)
Glucose: 92 mg/dL (ref 65–99)
POTASSIUM: 4 mmol/L (ref 3.5–5.2)
Sodium: 143 mmol/L (ref 134–144)
TOTAL PROTEIN: 6.9 g/dL (ref 6.0–8.5)

## 2017-07-31 LAB — PTH, INTACT AND CALCIUM: PTH: 41 pg/mL (ref 15–65)

## 2017-08-03 ENCOUNTER — Encounter: Payer: Self-pay | Admitting: Family Medicine

## 2017-08-03 ENCOUNTER — Ambulatory Visit (INDEPENDENT_AMBULATORY_CARE_PROVIDER_SITE_OTHER): Payer: Medicare HMO | Admitting: Family Medicine

## 2017-08-03 VITALS — BP 137/73 | HR 72 | Temp 97.7°F | Ht 62.0 in | Wt 125.0 lb

## 2017-08-03 DIAGNOSIS — R05 Cough: Secondary | ICD-10-CM | POA: Diagnosis not present

## 2017-08-03 DIAGNOSIS — R059 Cough, unspecified: Secondary | ICD-10-CM

## 2017-08-03 NOTE — Progress Notes (Signed)
   HPI  Patient presents today here for follow-up cough as well as discussion of her chest x-ray results.  Chest x-ray showed hyperinflation of the lungs consistent with COPD.  Patient states that she has had some intermittent cough which she frequently attributes to allergies and possibly mold in her house as it is older.  She reports significant past medical history of secondhand smoke exposure with her husband for at least 10 years and her father growing up who she describes as a "chain smoker".  She denies any shortness of breath or frequent coughing illnesses.  Her cough last visit was felt to be due to postnasal drip, she has not tried [facility].  PMH: Smoking status noted ROS: Per HPI  Objective: BP 137/73 (BP Location: Right Arm)   Pulse 72   Temp 97.7 F (36.5 C) (Oral)   Ht 5\' 2"  (1.575 m)   Wt 125 lb (56.7 kg)   BMI 22.86 kg/m  Gen: NAD, alert, cooperative with exam HEENT: NCAT CV: RRR, good S1/S2, no murmur Resp: CTABL, no wheezes, non-labored Ext: No edema, warm Neuro: Alert and oriented, No gross deficits  Chest x-ray reviewed with the patient, hyperinflation  Assessment and plan:  #Cough Intermittent cough, previously felt to be due to allergies Possible postnasal drip X-ray with hyperinflation, the patient is concerned it would like to meet with pulmonology to consider PFTs Referral placed, appreciate their evaluation and opinion.     Orders Placed This Encounter  Procedures  . Ambulatory referral to Pulmonology    Referral Priority:   Routine    Referral Type:   Consultation    Referral Reason:   Specialty Services Required    Referred to Provider:   Sinda Du, MD    Requested Specialty:   Pulmonary Disease    Number of Visits Requested:   [place], MD [facility] 08/03/2017, 1:33 PM

## 2017-08-05 ENCOUNTER — Telehealth: Payer: Self-pay | Admitting: Family Medicine

## 2017-08-12 ENCOUNTER — Telehealth: Payer: Self-pay | Admitting: Family Medicine

## 2017-08-17 NOTE — Telephone Encounter (Signed)
Pt calling about this referral appt. She is calling for an update. If she cannot get a referral to [facility] then she will go to [place]. Please call back

## 2017-08-17 NOTE — Telephone Encounter (Signed)
rc for [employer]

## 2017-08-17 NOTE — Telephone Encounter (Signed)
Pt aware of new appointment date/time in [facility]

## 2017-08-30 DIAGNOSIS — H4010X0 Unspecified open-angle glaucoma, stage unspecified: Secondary | ICD-10-CM | POA: Diagnosis not present

## 2017-09-24 DIAGNOSIS — R69 Illness, unspecified: Secondary | ICD-10-CM | POA: Diagnosis not present

## 2017-09-24 DIAGNOSIS — J4530 Mild persistent asthma, uncomplicated: Secondary | ICD-10-CM | POA: Diagnosis not present

## 2017-09-24 DIAGNOSIS — J309 Allergic rhinitis, unspecified: Secondary | ICD-10-CM | POA: Diagnosis not present

## 2017-09-24 DIAGNOSIS — R05 Cough: Secondary | ICD-10-CM | POA: Diagnosis not present

## 2017-09-27 DIAGNOSIS — R69 Illness, unspecified: Secondary | ICD-10-CM | POA: Diagnosis not present

## 2017-11-25 ENCOUNTER — Other Ambulatory Visit: Payer: Self-pay | Admitting: Family

## 2017-11-25 DIAGNOSIS — Z1231 Encounter for screening mammogram for malignant neoplasm of breast: Secondary | ICD-10-CM

## 2018-01-06 ENCOUNTER — Ambulatory Visit (HOSPITAL_COMMUNITY)
Admission: RE | Admit: 2018-01-06 | Discharge: 2018-01-06 | Disposition: A | Discharge status: Home / Self Care | Payer: Medicare HMO | Source: Ambulatory Visit | Attending: Family | Admitting: Family

## 2018-01-06 ENCOUNTER — Other Ambulatory Visit: Payer: Self-pay | Admitting: Pediatrics

## 2018-01-06 DIAGNOSIS — Z1231 Encounter for screening mammogram for malignant neoplasm of breast: Secondary | ICD-10-CM | POA: Diagnosis not present

## 2018-01-25 DIAGNOSIS — R69 Illness, unspecified: Secondary | ICD-10-CM | POA: Diagnosis not present

## 2018-01-31 ENCOUNTER — Encounter: Payer: Self-pay | Admitting: Pediatrics

## 2018-01-31 ENCOUNTER — Ambulatory Visit (INDEPENDENT_AMBULATORY_CARE_PROVIDER_SITE_OTHER): Payer: Medicare HMO | Admitting: Pediatrics

## 2018-01-31 VITALS — BP 130/77 | HR 66 | Temp 97.8°F | Ht 62.0 in | Wt 124.2 lb

## 2018-01-31 DIAGNOSIS — J4520 Mild intermittent asthma, uncomplicated: Secondary | ICD-10-CM | POA: Diagnosis not present

## 2018-01-31 DIAGNOSIS — Z8639 Personal history of other endocrine, nutritional and metabolic disease: Secondary | ICD-10-CM

## 2018-01-31 DIAGNOSIS — I10 Essential (primary) hypertension: Secondary | ICD-10-CM | POA: Diagnosis not present

## 2018-01-31 NOTE — Progress Notes (Signed)
  Subjective:   Patient ID: Kimberly Aguilar, female    DOB: [birth year], 67 y.o.   MRN: [id number] CC: Medical Management of Chronic Issues  HPI: Kimberly Aguilar is a 67 y.o. female   Atopic asthma: Seen by pulmonology since last visit.  Started on [place]. Dusty conditions are the only times she is still regularly bothered by the cough.  She tries to avoid dusty conditions.  Hypertension: Taking medicine regularly.  Very rarely has lightheadedness or dizziness when she stands up.  History of hyperparathyroidism: 1 of her parathyroids was removed several years ago.  Calcium levels have been normal since then.  Relevant past medical, surgical, family and social history reviewed. Allergies and medications reviewed and updated. Social History   Tobacco Use  Smoking Status Former Smoker  . Types: Cigarettes  . Last attempt to quit: 03/31/1973  . Years since quitting: 44.8  Smokeless Tobacco Never Used   ROS: Per HPI   Objective:    BP 130/77   Pulse 66   Temp 97.8 F (36.6 C) (Oral)   Ht 5\' 2"  (1.575 m)   Wt 124 lb 3.2 oz (56.3 kg)   BMI 22.72 kg/m   Wt Readings from Last 3 Encounters:  01/31/18 124 lb 3.2 oz (56.3 kg)  08/03/17 125 lb (56.7 kg)  07/30/17 125 lb (56.7 kg)    Gen: NAD, alert, cooperative with exam, NCAT EYES: EOMI, no conjunctival injection, or no icterus ENT:  TMs pearly gray b/l, OP without erythema LYMPH: no cervical LAD CV: NRRR, normal S1/S2, no murmur, distal pulses 2+ b/l Resp: CTABL, no wheezes, normal WOB Ext: No edema, warm Neuro: Alert and oriented MSK: normal muscle bulk  Assessment & Plan:  Kimberly Aguilar was seen today for medical management of chronic issues.  Diagnoses and all orders for this visit:  Essential hypertension Stable, continue current medicine.  We will check blood work next visit.  Mild intermittent extrinsic asthma without complication Stable, symptoms improved on the [place].  Trying to avoid dusty conditions to help avoid the  cough.  History of hyperparathyroidism We will check blood work next visit.  Has been asymptomatic, blood work recently has been normal since removal of the parathyroid adenoma  Follow up plan: Return in about 6 months (around 08/02/2018) for well exam. Assunta Found, MD [place]

## 2018-02-15 ENCOUNTER — Ambulatory Visit (INDEPENDENT_AMBULATORY_CARE_PROVIDER_SITE_OTHER): Payer: Medicare HMO | Admitting: *Deleted

## 2018-02-15 ENCOUNTER — Encounter: Payer: Self-pay | Admitting: *Deleted

## 2018-02-15 VITALS — BP 128/71 | HR 69 | Ht 62.0 in | Wt 126.0 lb

## 2018-02-15 DIAGNOSIS — Z0000 Encounter for general adult medical examination without abnormal findings: Secondary | ICD-10-CM

## 2018-02-15 NOTE — Progress Notes (Signed)
Subjective:   Kimberly Aguilar is a 67 y.o. female who presents for an Initial Medicare Annual Wellness Visit.  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  She now sits with friends and family members as needed.  She enjoys reading, taking Zumba classes, and baking.  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  She has a son and a daughter, her son is deceased.  She has 3 grandchildren.  Kimberly Aguilar is a widow, she moved in with her sister after her son passed away in 2022-06-27. She feels her health unchanged from last year, and reports no hospitalizations, ER visits, or surgeries in the past year.   Review of Systems     All negative today        Objective:    Today's Vitals   02/15/18 1557  BP: 128/71  Pulse: 69  Weight: 126 lb (57.2 kg)  Height: 5\' 2"  (1.575 m)  PainSc: 0-No pain   Body mass index is 23.05 kg/m.  Advanced Directives 02/15/2018 07/02/2016 08/07/2013 07/31/2013  Does Patient Have a Medical Advance Directive? No No Patient does not have advance directive Patient does not have advance directive;Patient would like information  Would patient like information on creating a medical advance directive? Yes (MAU/Ambulatory/Procedural Areas - Information given) - - Advance directive packet given    Current Medications (verified) Outpatient Encounter Medications as of 02/15/2018  Medication Sig  . acetaminophen (TYLENOL) 500 MG tablet Take 500 mg by mouth every 6 (six) hours as needed for mild pain or moderate pain.   Marland Kitchen amLODipine (NORVASC) 2.5 MG tablet Take 1 tablet (2.5 mg total) by mouth every morning.  Marland Kitchen BREO ELLIPTA 100-25 MCG/INH AEPB Inhale 1 puff into the lungs daily.   . Flaxseed, Linseed, (BL FLAX SEED OIL PO) Take 1 tablet by mouth daily.   . fluticasone (FLONASE) 50 MCG/ACT nasal spray Place 2 sprays into both nostrils daily. (Patient taking  differently: Place 2 sprays into both nostrils daily as needed. )   No facility-administered encounter medications on file as of 02/15/2018.     Allergies (verified) Patient has no known allergies.   History: Past Medical History:  Diagnosis Date  . Adenoma    PARATHYROID  . Asthma   . Colon polyp   . Hypercalcemia   . Hypertension   . Parathyroid adenoma 2015   Past Surgical History:  Procedure Laterality Date  . COLONOSCOPY  2012  . cyst removal from left breast    . PARATHYROIDECTOMY Right 08/07/2013   Procedure: PARATHYROIDECTOMY;  Surgeon: [name], MD;  Location: [facility];  Service: General;  Laterality: Right;   Family History  Problem Relation Age of Onset  . Hypertension Mother   . Heart disease Mother   . Hypertension Father   . Congestive Heart Failure Brother   . Heart disease Brother   . Heart disease Brother   . Diabetes Brother   . Diabetes Sister   . Seizures Brother   . Cancer Sister 44       Uterine Cancer had hysterectomy  . Hypertension Brother   . Hyperlipidemia Brother   . Seizures Son   . Colon cancer Neg Hx    Social History   Socioeconomic History  . Marital status: Widowed    Spouse name: Not on file  . Number of children: Not on file  . Years  of education: Not on file  . Highest education level: Not on file  Occupational History  . Not on file  Social Needs  . Financial resource strain: Not on file  . Food insecurity:    Worry: Not on file    Inability: Not on file  . Transportation needs:    Medical: Not on file    Non-medical: Not on file  Tobacco Use  . Smoking status: Former Smoker    Types: Cigarettes    Last attempt to quit: 03/31/1973    Years since quitting: 44.9  . Smokeless tobacco: Never Used  Substance and Sexual Activity  . Alcohol use: No  . Drug use: No  . Sexual activity: Not on file  Lifestyle  . Physical activity:    Days per week: Not on file    Minutes per session: Not on file  . Stress: Not  on file  Relationships  . Social connections:    Talks on phone: Not on file    Gets together: Not on file    Attends religious service: Not on file    Active member of club or organization: Not on file    Attends meetings of clubs or organizations: Not on file    Relationship status: Not on file  Other Topics Concern  . Not on file  Social History Narrative  . Not on file    Tobacco Counseling Counseling given: No   Clinical Intake:     Pain Score: 0-No pain                  Activities of Daily Living In your present state of health, do you have any difficulty performing the following activities: 02/15/2018  Hearing? N  Vision? N  Difficulty concentrating or making decisions? N  Walking or climbing stairs? N  Dressing or bathing? N  Doing errands, shopping? N  Preparing Food and eating ? N  Using the Toilet? N  In the past six months, have you accidently leaked urine? N  Do you have problems with loss of bowel control? N  Managing your Medications? N  Managing your Finances? N  Housekeeping or managing your Housekeeping? N  Some recent data might be hidden     Immunizations and Health Maintenance  There is no immunization history on file for this patient. Health Maintenance Due  Topic Date Due  . Hepatitis C Screening  07-01-1950    Patient Care Team: Eustaquio Maize, MD as PCP - General (Pediatrics) Claris Pong, MD as Referring Physician (Internal Medicine) Maple Hudson, MD (Ophthalmology)       Assessment:   This is a routine wellness examination for Kimberly Aguilar.  Hearing/Vision screen No hearing deficit noted Patient states she sees well.  Followed by Dr. Valentino Saxon opthalmology  Dietary issues and exercise activities discussed:    Patient walks for exercise at least 3 times per week for 20-30 minutes and does Zumba 2 times per week.   She states she eats 3 meals per day.  Usually cereal or eggs and toast for breakfast, sandwich and  fruit for lunch, and vegetables and protein for supper with fruit, and popcorn or chips for snack after dinner.  Recommended diet of mostly vegetables, fruits, lean proteins, and whole grains.   Goals    . Cut out extra servings (pt-stated)     Reduce extra snacking at night after supper.       Depression Screen PHQ 2/9 Scores 02/15/2018 01/31/2018 08/03/2017  07/30/2017 04/05/2017 10/16/2016 07/07/2016  PHQ - 2 Score 0 0 0 0 0 0 0  PHQ- 9 Score - - - - - - -    Fall Risk Fall Risk  02/15/2018 01/31/2018 08/03/2017 07/30/2017 04/05/2017  Falls in the past year? No No No No No    Is the patient's home free of loose throw rugs in walkways, pet beds, electrical cords, etc?   yes      Grab bars in the bathroom? no      Handrails on the stairs?   no stairs in home       Adequate lighting?   yes    Cognitive Function: MMSE - Mini Mental State Exam 02/15/2018  Orientation to time 5  Orientation to Place 5  Registration 3  Attention/ Calculation 5  Recall 3  Language- name 2 objects 2  Language- repeat 1  Language- follow 3 step command 3  Language- read & follow direction 1  Write a sentence 1  Copy design 1  Total score 30        Screening Tests Health Maintenance  Topic Date Due  . Hepatitis C Screening  17-Jun-1950  . INFLUENZA VACCINE  02/01/2019 (Originally 11/18/2017)  . PNA vac Low Risk Adult (1 of 2 - PCV13) 02/01/2019 (Originally 05/12/2016)  . MAMMOGRAM  01/07/2019  . COLONOSCOPY  07/08/2019  . TETANUS/TDAP  01/24/2021  . DEXA SCAN  Completed   Declined Prevnar 13 and influenza vaccines today.  Qualifies for Shingles Vaccine? Declined today  Cancer Screenings: Lung: Low Dose CT Chest recommended if Age 37-80 years, 30 pack-year currently smoking OR have quit w/in 15years. Patient does not qualify. Breast: Up to date on Mammogram? Yes   Up to date of Bone Density/Dexa? Yes Colorectal: up to date  Additional Screenings:  Hepatitis C Screening:  Recommend at next  visit with PCP.     Plan:     Work on your goal of reducing snacking at night after supper. Review the information given on Advance Directives, and if you complete the paper work please bring a copy to our office to be filed in your medical record.  Consider getting your Prevnar 13 (pneumonia), Shingrix (shingles), and flu vaccines. Follow up with PCP as scheduled.   I have personally reviewed and noted the following in the patient's chart:   . Medical and social history . Use of alcohol, tobacco or illicit drugs  . Current medications and supplements . Functional ability and status . Nutritional status . Physical activity . Advanced directives . List of other physicians . Hospitalizations, surgeries, and ER visits in previous 12 months . Vitals . Screenings to include cognitive, depression, and falls . Referrals and appointments  In addition, I have reviewed and discussed with patient certain preventive protocols, quality metrics, and best practice recommendations. A written personalized care plan for preventive services as well as general preventive health recommendations were provided to patient.     Tykeshia Tourangeau M, RN   02/15/2018   I have reviewed and agree with the above AWV documentation.  Claretta Fraise, M.D.

## 2018-02-15 NOTE — Patient Instructions (Signed)
You set a goal of reducing snacking at night after supper.  Please review the information given on Advance Directives, and if you complete the paper work please bring a copy to our office to be filed in your medical record.   Consider getting your Prevnar 13 (pneumonia), Shingrix (shingles), and flu vaccines.  Please follow up with Dr. Evette Doffing as scheduled.  Thank you for coming in for your Annual Wellness Visit today!   Preventive Care 66 Years and Older, Female Preventive care refers to lifestyle choices and visits with your health care provider that can promote health and wellness. What does preventive care include?  A yearly physical exam. This is also called an annual well check.  Dental exams once or twice a year.  Routine eye exams. Ask your health care provider how often you should have your eyes checked.  Personal lifestyle choices, including: ? Daily care of your teeth and gums. ? Regular physical activity. ? Eating a healthy diet. ? Avoiding tobacco and drug use. ? Limiting alcohol use. ? Practicing safe sex. ? Taking low-dose aspirin every day. ? Taking vitamin and mineral supplements as recommended by your health care provider. What happens during an annual well check? The services and screenings done by your health care provider during your annual well check will depend on your age, overall health, lifestyle risk factors, and family history of disease. Counseling Your health care provider may ask you questions about your:  Alcohol use.  Tobacco use.  Drug use.  Emotional well-being.  Home and relationship well-being.  Sexual activity.  Eating habits.  History of falls.  Memory and ability to understand (cognition).  Work and work Statistician.  Reproductive health.  Screening You may have the following tests or measurements:  Height, weight, and BMI.  Blood pressure.  Lipid and cholesterol levels. These may be checked every 5 years, or more  frequently if you are over 15 years old.  Skin check.  Lung cancer screening. You may have this screening every year starting at age 66 if you have a 30-pack-year history of smoking and currently smoke or have quit within the past 15 years.  Fecal occult blood test (FOBT) of the stool. You may have this test every year starting at age 66.  Flexible sigmoidoscopy or colonoscopy. You may have a sigmoidoscopy every 5 years or a colonoscopy every 10 years starting at age 66.  Hepatitis C blood test.  Hepatitis B blood test.  Sexually transmitted disease (STD) testing.  Diabetes screening. This is done by checking your blood sugar (glucose) after you have not eaten for a while (fasting). You may have this done every 1-3 years.  Bone density scan. This is done to screen for osteoporosis. You may have this done starting at age 66.  Mammogram. This may be done every 1-2 years. Talk to your health care provider about how often you should have regular mammograms.  Talk with your health care provider about your test results, treatment options, and if necessary, the need for more tests. Vaccines Your health care provider may recommend certain vaccines, such as:  Influenza vaccine. This is recommended every year.  Tetanus, diphtheria, and acellular pertussis (Tdap, Td) vaccine. You may need a Td booster every 10 years.  Varicella vaccine. You may need this if you have not been vaccinated.  Zoster vaccine. You may need this after age 66.  Measles, mumps, and rubella (MMR) vaccine. You may need at least one dose of MMR if you were  born in [birth year] or later. You may also need a second dose.  Pneumococcal 13-valent conjugate (PCV13) vaccine. One dose is recommended after age 66.  Pneumococcal polysaccharide (PPSV23) vaccine. One dose is recommended after age 50.  Meningococcal vaccine. You may need this if you have certain conditions.  Hepatitis A vaccine. You may need this if you have certain  conditions or if you travel or work in places where you may be exposed to hepatitis A.  Hepatitis B vaccine. You may need this if you have certain conditions or if you travel or work in places where you may be exposed to hepatitis B.  Haemophilus influenzae type b (Hib) vaccine. You may need this if you have certain conditions.  Talk to your health care provider about which screenings and vaccines you need and how often you need them. This information is not intended to replace advice given to you by your health care provider. Make sure you discuss any questions you have with your health care provider. Document Released: 05/03/2015 Document Revised: 12/25/2015 Document Reviewed: 02/05/2015 Elsevier Interactive Patient Education  [employer].

## 2018-02-18 DIAGNOSIS — J4540 Moderate persistent asthma, uncomplicated: Secondary | ICD-10-CM | POA: Diagnosis not present

## 2018-02-18 DIAGNOSIS — J302 Other seasonal allergic rhinitis: Secondary | ICD-10-CM | POA: Diagnosis not present

## 2018-03-07 DIAGNOSIS — R69 Illness, unspecified: Secondary | ICD-10-CM | POA: Diagnosis not present

## 2018-03-21 DIAGNOSIS — R69 Illness, unspecified: Secondary | ICD-10-CM | POA: Diagnosis not present

## 2018-05-05 DIAGNOSIS — R69 Illness, unspecified: Secondary | ICD-10-CM | POA: Diagnosis not present

## 2018-08-02 ENCOUNTER — Ambulatory Visit: Payer: Medicare HMO | Admitting: Family Medicine

## 2018-08-04 ENCOUNTER — Encounter: Payer: Self-pay | Admitting: Family

## 2018-08-04 ENCOUNTER — Other Ambulatory Visit: Payer: Self-pay

## 2018-08-04 ENCOUNTER — Ambulatory Visit (INDEPENDENT_AMBULATORY_CARE_PROVIDER_SITE_OTHER): Payer: Medicare HMO | Admitting: Family

## 2018-08-04 DIAGNOSIS — J45909 Unspecified asthma, uncomplicated: Secondary | ICD-10-CM | POA: Diagnosis not present

## 2018-08-04 DIAGNOSIS — Z8639 Personal history of other endocrine, nutritional and metabolic disease: Secondary | ICD-10-CM | POA: Insufficient documentation

## 2018-08-04 DIAGNOSIS — I10 Essential (primary) hypertension: Secondary | ICD-10-CM | POA: Diagnosis not present

## 2018-08-04 MED ORDER — BREO ELLIPTA 100-25 MCG/INH IN AEPB
1.0000 | INHALATION_SPRAY | Freq: Every day | RESPIRATORY_TRACT | 3 refills | Status: DC
Start: 2018-08-04 — End: 2018-11-07

## 2018-08-04 MED ORDER — AMLODIPINE BESYLATE 2.5 MG PO TABS: 2.5000 mg | ORAL_TABLET | Freq: Every morning | ORAL | 3 refills | Status: DC

## 2018-08-04 NOTE — Progress Notes (Signed)
   Virtual Visit via telephone Note  I connected with Kimberly Aguilar on 08/04/18 at 11:16 AM by telephone and verified that I am speaking with the correct person using two identifiers. Kimberly Aguilar is currently located at work and no one is currently with her during visit. The provider, Evelina Dun, FNP is located in their office at time of visit.  I discussed the limitations, risks, security and privacy concerns of performing an evaluation and management service by telephone and the availability of in person appointments. I also discussed with the patient that there may be a patient responsible charge related to this service. The patient expressed understanding and agreed to proceed.   History and Present Illness:  PT calls today for chronic follow up. Has hx of hyperparathyroidism and has had on of her parathyroid removed in past. Her Calcium levels normal.  Hypertension  This is a chronic problem. The current episode started more than 1 year ago. The problem is controlled. Pertinent negatives include no chest pain, peripheral edema, shortness of breath or sweats. Risk factors for coronary artery disease include sedentary lifestyle. The current treatment provides moderate improvement. There is no history of kidney disease, CAD/MI or heart failure.  Asthma  There is no cough, difficulty breathing or shortness of breath. This is a chronic problem. The current episode started more than 1 year ago. The problem occurs intermittently. Pertinent negatives include no chest pain, ear congestion, ear pain, sore throat or sweats. Her past medical history is significant for asthma.      Review of Systems  HENT: Negative for ear pain and sore throat.   Respiratory: Negative for cough and shortness of breath.   Cardiovascular: Negative for chest pain.  All other systems reviewed and are negative.   Observations/Objective: No SOB or distress   Assessment and Plan: 1. Essential hypertension -  amLODipine (NORVASC) 2.5 MG tablet; Take 1 tablet (2.5 mg total) by mouth every morning.  Dispense: 90 tablet; Refill: 3  2. H/O hyperparathyroidism  3. Extrinsic asthma without complication, unspecified asthma severity, unspecified whether persistent - BREO ELLIPTA 100-25 MCG/INH AEPB; Inhale 1 puff into the lungs daily.  Dispense: 1 each; Refill: 3  Continue medications Will RTO in 2 months to establish care with Dr. Lajuana Ripple and have lab work Encourage healthy diet and exercise  Follow Up Instructions: 2 months with Dr. Lajuana Ripple    I discussed the assessment and treatment plan with the patient. The patient was provided an opportunity to ask questions and all were answered. The patient agreed with the plan and demonstrated an understanding of the instructions.   The patient was advised to call back or seek an in-person evaluation if the symptoms worsen or if the condition fails to improve as anticipated.  The above assessment and management plan was discussed with the patient. The patient verbalized understanding of and has agreed to the management plan. Patient is aware to call the clinic if symptoms persist or worsen. Patient is aware when to return to the clinic for a follow-up visit. Patient educated on when it is appropriate to go to the emergency department.    Call ended 11:30 AM, I provided 14 minutes of non-face-to-face time during this encounter.    Evelina Dun, FNP

## 2018-09-19 DIAGNOSIS — R69 Illness, unspecified: Secondary | ICD-10-CM | POA: Diagnosis not present

## 2018-09-27 DIAGNOSIS — R69 Illness, unspecified: Secondary | ICD-10-CM | POA: Diagnosis not present

## 2018-11-04 ENCOUNTER — Other Ambulatory Visit: Payer: Self-pay

## 2018-11-07 ENCOUNTER — Ambulatory Visit (INDEPENDENT_AMBULATORY_CARE_PROVIDER_SITE_OTHER): Payer: Medicare HMO | Admitting: Family Medicine

## 2018-11-07 ENCOUNTER — Other Ambulatory Visit: Payer: Self-pay

## 2018-11-07 ENCOUNTER — Encounter: Payer: Self-pay | Admitting: Family Medicine

## 2018-11-07 VITALS — BP 131/82 | HR 78 | Temp 98.0°F | Ht 62.0 in | Wt 133.0 lb

## 2018-11-07 DIAGNOSIS — Z9889 Other specified postprocedural states: Secondary | ICD-10-CM | POA: Diagnosis not present

## 2018-11-07 DIAGNOSIS — J45909 Unspecified asthma, uncomplicated: Secondary | ICD-10-CM

## 2018-11-07 DIAGNOSIS — E892 Postprocedural hypoparathyroidism: Secondary | ICD-10-CM

## 2018-11-07 DIAGNOSIS — E785 Hyperlipidemia, unspecified: Secondary | ICD-10-CM | POA: Diagnosis not present

## 2018-11-07 DIAGNOSIS — I10 Essential (primary) hypertension: Secondary | ICD-10-CM

## 2018-11-07 DIAGNOSIS — R7989 Other specified abnormal findings of blood chemistry: Secondary | ICD-10-CM | POA: Diagnosis not present

## 2018-11-07 MED ORDER — BREO ELLIPTA 100-25 MCG/INH IN AEPB
1.0000 | INHALATION_SPRAY | Freq: Every day | RESPIRATORY_TRACT | 3 refills | Status: DC
Start: 2018-11-07 — End: 2019-06-01

## 2018-11-07 NOTE — Patient Instructions (Signed)

## 2018-11-07 NOTE — Progress Notes (Signed)
Subjective: CC: Follow-up hypertension, adenoma parathyroid PCP: Janora Norlander, DO FWY:OVZC Ogden is a 68 y.o. female presenting to clinic today for:  1.  Hypertension Patient reports compliance with Norvasc 2.5 mg daily.  Denies any chest pain, shortness of breath, lower extremity edema.  2.  Parathyroid adenoma Patient with history of hyperparathyroidism.  She had a single parathyroidectomy several years ago and has had normal calcium levels ever since.  She denies any confusion, abdominal pain, renal stones or bone pain.  3. Asthma Patient reports compliance with Breo.  She notes her asthma is allergy induced.  She denies any recent flares including wheezing, shortness of breath or change in exercise tolerance.  ROS: Per HPI  No Known Allergies Past Medical History:  Diagnosis Date  . Adenoma    PARATHYROID  . Asthma   . Colon polyp   . Hypercalcemia   . Hyperparathyroidism ([place]) 06/30/2013  . Hypertension   . Parathyroid adenoma 03-Jun-2013    Current Outpatient Medications:  .  amLODipine (NORVASC) 2.5 MG tablet, Take 1 tablet (2.5 mg total) by mouth every morning., Disp: 90 tablet, Rfl: 3 .  BREO ELLIPTA 100-25 MCG/INH AEPB, Inhale 1 puff into the lungs daily., Disp: 1 each, Rfl: 3 .  Flaxseed, Linseed, (BL FLAX SEED OIL PO), Take 1 tablet by mouth daily. , Disp: , Rfl:  Social History   Socioeconomic History  . Marital status: Widowed    Spouse name: Not on file  . Number of children: 2  . Years of education: Not on file  . Highest education level: Not on file  Occupational History  . Not on file  Social Needs  . Financial resource strain: Not on file  . Food insecurity    Worry: Not on file    Inability: Not on file  . Transportation needs    Medical: Not on file    Non-medical: Not on file  Tobacco Use  . Smoking status: Former Smoker    Types: Cigarettes    Quit date: 03/31/1973    Years since quitting: 45.6  . Smokeless tobacco: Never Used   Substance and Sexual Activity  . Alcohol use: No  . Drug use: No  . Sexual activity: Not on file  Lifestyle  . Physical activity    Days per week: Not on file    Minutes per session: Not on file  . Stress: Not on file  Relationships  . Social Herbalist on phone: Not on file    Gets together: Not on file    Attends religious service: Not on file    Active member of club or organization: Not on file    Attends meetings of clubs or organizations: Not on file    Relationship status: Not on file  . Intimate partner violence    Fear of current or ex partner: Not on file    Emotionally abused: Not on file    Physically abused: Not on file    Forced sexual activity: Not on file  Other Topics Concern  . Not on file  Social History Narrative   Ms Rhett has a daughter who resides in [place]   She had a son, who was disabled after he sustained TBI 2/2 meningitis, but he passed away 06-03-17.   Family History  Problem Relation Age of Onset  . Hypertension Mother   . Heart disease Mother   . Hypertension Father   . Congestive Heart Failure Brother   .  Heart disease Brother   . Heart disease Brother   . Diabetes Brother   . Diabetes Sister   . Seizures Brother   . Cancer Sister 48       Uterine Cancer had hysterectomy  . Hypertension Brother   . Hyperlipidemia Brother   . Seizures Son   . Colon cancer Neg Hx     Objective: Office vital signs reviewed. BP 131/82   Pulse 78   Temp 98 F (36.7 C) (Oral)   Ht _0  (1.575 m)   Wt 133 lb (60.3 kg)   BMI 24.33 kg/m   Physical Examination:  General: Awake, alert, well nourished, No acute distress HEENT: Normal; negative Chvostek's sign Cardio: regular rate and rhythm, S1S2 heard, no murmurs appreciated Pulm: clear to auscultation bilaterally, no wheezes, rhonchi or rales; normal work of breathing on room air Extremities: warm, well perfused, No edema, cyanosis or clubbing; +2 pulses bilaterally Psych: mood  stable, speech normal. Pleasant  Assessment/ Plan: 68 y.o. female   1. Essential hypertension Well-controlled.  Continue Norvasc 2.5 mg daily - CMP14+EGFR  2. Status post parathyroidectomy Check PTH, CMP.  No evidence of hypocalcemia during today's visit - CMP14+EGFR - PTH, Intact and Calcium  3. Abnormal TSH Check TSH - TSH  4. Extrinsic asthma without complication, unspecified asthma severity, unspecified whether persistent Well-controlled with Breo - BREO ELLIPTA 100-25 MCG/INH AEPB; Inhale 1 puff into the lungs daily.  Dispense: 3 each; Refill: 3  5. Dyslipidemia Check fasting lipid panel - Lipid Panel   Orders Placed This Encounter  Procedures  . CMP14+EGFR  . TSH  . PTH, Intact and Calcium  . Lipid Panel   Meds ordered this encounter  Medications  . BREO ELLIPTA 100-25 MCG/INH AEPB    Sig: Inhale 1 puff into the lungs daily.    Dispense:  3 each    Refill:  [place], [place] [phone]

## 2018-11-08 ENCOUNTER — Other Ambulatory Visit: Payer: Self-pay | Admitting: Family Medicine

## 2018-11-08 DIAGNOSIS — E7800 Pure hypercholesterolemia, unspecified: Secondary | ICD-10-CM

## 2018-11-08 LAB — LIPID PANEL
Chol/HDL Ratio: 2.6 ratio (ref 0.0–4.4)
Cholesterol, Total: 240 mg/dL — ABNORMAL HIGH (ref 100–199)
HDL: 94 mg/dL (ref >39)
LDL Calculated: 132 mg/dL — ABNORMAL HIGH (ref 0–99)
Triglycerides: 68 mg/dL (ref 0–149)
VLDL Cholesterol Cal: 14 mg/dL (ref 5–40)

## 2018-11-08 LAB — CMP14+EGFR
ALT: 12 IU/L (ref 0–32)
AST: 15 IU/L (ref 0–40)
Albumin/Globulin Ratio: 1.9 (ref 1.2–2.2)
Albumin: 4.4 g/dL (ref 3.8–4.8)
Alkaline Phosphatase: 62 IU/L (ref 39–117)
BUN/Creatinine Ratio: 17 (ref 12–28)
BUN: 18 mg/dL (ref 8–27)
Bilirubin Total: 0.4 mg/dL (ref 0.0–1.2)
CO2: 26 mmol/L (ref 20–29)
Calcium: 10 mg/dL (ref 8.7–10.3)
Chloride: 102 mmol/L (ref 96–106)
Creatinine, Ser: 1.07 mg/dL — ABNORMAL HIGH (ref 0.57–1.00)
GFR calc Af Amer: 62 mL/min/{1.73_m2} (ref >59)
GFR calc non Af Amer: 54 mL/min/{1.73_m2} — ABNORMAL LOW (ref >59)
Globulin, Total: 2.3 g/dL (ref 1.5–4.5)
Glucose: 95 mg/dL (ref 65–99)
Potassium: 4.2 mmol/L (ref 3.5–5.2)
Sodium: 143 mmol/L (ref 134–144)
Total Protein: 6.7 g/dL (ref 6.0–8.5)

## 2018-11-08 LAB — TSH: TSH: 4.71 u[IU]/mL — ABNORMAL HIGH (ref 0.450–4.500)

## 2018-11-08 LAB — PTH, INTACT AND CALCIUM: PTH: 34 pg/mL (ref 15–65)

## 2018-11-08 MED ORDER — ATORVASTATIN CALCIUM 20 MG PO TABS: 20.0000 mg | ORAL_TABLET | Freq: Every day | ORAL | 3 refills | Status: DC

## 2018-11-28 ENCOUNTER — Other Ambulatory Visit (HOSPITAL_COMMUNITY): Payer: Self-pay | Admitting: Family Medicine

## 2018-11-28 DIAGNOSIS — Z1231 Encounter for screening mammogram for malignant neoplasm of breast: Secondary | ICD-10-CM

## 2019-01-16 ENCOUNTER — Ambulatory Visit (HOSPITAL_COMMUNITY): Payer: Medicare HMO

## 2019-02-10 ENCOUNTER — Other Ambulatory Visit: Payer: Self-pay

## 2019-02-10 ENCOUNTER — Ambulatory Visit (HOSPITAL_COMMUNITY)
Admission: RE | Admit: 2019-02-10 | Discharge: 2019-02-10 | Disposition: A | Discharge status: Home / Self Care | Payer: Medicare HMO | Source: Ambulatory Visit | Attending: Family Medicine | Admitting: Family Medicine

## 2019-02-10 DIAGNOSIS — Z1231 Encounter for screening mammogram for malignant neoplasm of breast: Secondary | ICD-10-CM | POA: Diagnosis not present

## 2019-02-20 ENCOUNTER — Telehealth: Payer: Self-pay | Admitting: Family Medicine

## 2019-02-21 ENCOUNTER — Encounter: Payer: Self-pay | Admitting: Family Medicine

## 2019-02-21 ENCOUNTER — Other Ambulatory Visit: Payer: Self-pay

## 2019-02-21 ENCOUNTER — Ambulatory Visit (INDEPENDENT_AMBULATORY_CARE_PROVIDER_SITE_OTHER): Payer: Medicare HMO | Admitting: Family Medicine

## 2019-02-21 VITALS — BP 138/77 | HR 76 | Temp 97.8°F | Ht 62.0 in | Wt 135.0 lb

## 2019-02-21 DIAGNOSIS — Z8639 Personal history of other endocrine, nutritional and metabolic disease: Secondary | ICD-10-CM

## 2019-02-21 DIAGNOSIS — R7989 Other specified abnormal findings of blood chemistry: Secondary | ICD-10-CM

## 2019-02-21 DIAGNOSIS — E7800 Pure hypercholesterolemia, unspecified: Secondary | ICD-10-CM | POA: Diagnosis not present

## 2019-02-21 DIAGNOSIS — Z2821 Immunization not carried out because of patient refusal: Secondary | ICD-10-CM | POA: Diagnosis not present

## 2019-02-21 NOTE — Progress Notes (Signed)
Subjective: CC: Follow-up lipid, abnormal TSH PCP: Janora Norlander, DO Kimberly Aguilar is a 68 y.o. female presenting to clinic today for:  1.  Hyperlipidemia/abnormal TSH Patient had fasting labs performed in July and was noted to have an elevated LDL to 132.  Her ASCVD risk score was 12%.  Lipitor was recommended but patient wanted to try and pursue diet modification prior to starting any medications.  She does report being physically active and trying to maintain a low fat, healthy diet.  She was found to have a slightly elevated TSH as well.  Patient with history of hyperparathyroidism and parathyroidectomy on the right.  She denies heart palpitations, difficulty swallowing, changes in voice, change in bowel habit or weight.  Sometimes she feels shaky in the hands or the legs but this is rare.  ROS: Per HPI  No Known Allergies Past Medical History:  Diagnosis Date  . Adenoma    PARATHYROID  . Asthma   . Colon polyp   . Hypercalcemia   . Hyperparathyroidism ([place]) 06/30/2013  . Hypertension   . 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  amLODipine (NORVASC) 2.5 MG tablet, Take 1 tablet (2.5 mg total) by mouth every morning., Disp: 90 tablet, Rfl: 3 .  BREO ELLIPTA 100-25 MCG/INH AEPB, Inhale 1 puff into the lungs daily., Disp: 3 each, Rfl: 3 .  Flaxseed, Linseed, (BL FLAX SEED OIL PO), Take 1 tablet by mouth daily. , Disp: , Rfl:  Social History   Socioeconomic History  . Marital status: Widowed    Spouse name: Not on file  . Number of children: 2  . Years of education: Not on file  . Highest education level: Not on file  Occupational History  . Not on file  Social Needs  . Financial resource strain: Not on file  . Food insecurity    Worry: Not on file    Inability: Not on file  . Transportation needs    Medical: Not on file    Non-medical: Not on file  Tobacco Use  . Smoking status: Former Smoker    Types: Cigarettes    Quit date:  03/31/1973    Years since quitting: 45.9  . Smokeless tobacco: Never Used  Substance and Sexual Activity  . Alcohol use: No  . Drug use: No  . Sexual activity: Not on file  Lifestyle  . Physical activity    Days per week: Not on file    Minutes per session: Not on file  . Stress: Not on file  Relationships  . Social Herbalist on phone: Not on file    Gets together: Not on file    Attends religious service: Not on file    Active member of club or organization: Not on file    Attends meetings of clubs or organizations: Not on file    Relationship status: Not on file  . Intimate partner violence    Fear of current or ex partner: Not on file    Emotionally abused: Not on file    Physically abused: Not on file    Forced sexual activity: Not on file  Other Topics Concern  . Not on file  Social History Narrative   [name] has a daughter who resides in [place]   She had a son, who was disabled after he sustained TBI 2/2 meningitis, but he passed away 07-Jun-2017.   Family History  Problem Relation Age of Onset  .  Hypertension Mother   . Heart disease Mother   . Hypertension Father   . Congestive Heart Failure Brother   . Heart disease Brother   . Heart disease Brother   . Diabetes Brother   . Diabetes Sister   . Seizures Brother   . Cancer Sister 75       Uterine Cancer had hysterectomy  . Hypertension Brother   . Hyperlipidemia Brother   . Seizures Son   . Colon cancer Neg Hx     Objective: Office vital signs reviewed. BP 138/77   Pulse 76   Temp 97.8 F (36.6 C) (Temporal)   Ht _0  (1.575 m)   Wt 135 lb (61.2 kg)   SpO2 96%   BMI 24.69 kg/m   Physical Examination:  General: Awake, alert, well nourished, No acute distress HEENT: Normal; postsurgical scar noted on the right of the neck.  No appreciable goiter or exophthalmos Cardio: regular rate and rhythm, S1S2 heard, no murmurs appreciated Pulm: clear to auscultation bilaterally, no wheezes,  rhonchi or rales; normal work of breathing on room air Extremities: warm, well perfused, No edema, cyanosis or clubbing; +2 pulses bilaterally Skin: dry; intact; no rashes or lesions; normal temperature. Neuro: No tremor  Assessment/ Plan: 68 y.o. female   1. Abnormal TSH Check thyroid level.  We discussed that she may be having atrophy of the thyroid.  She has history of prior thyroidectomy which could also have contributed. - Thyroid Panel With TSH  2. Pure hypercholesterolemia May be related to underlying thyroid disorder versus genetics.  We discussed keeping an open mind about oral statins if levels are still above goal.  She seemed amenable to this - Lipid Panel  3. H/O hyperparathyroidism - CMP14+EGFR  4. Influenza vaccination declined by patient  5. Pneumococcal vaccination declined by patient   Orders Placed This Encounter  Procedures  . Thyroid Panel With TSH  . CMP14+EGFR  . LDL Cholesterol, Direct   No orders of the defined types were placed in this encounter.    Janora Norlander, DO [place] [phone]

## 2019-02-21 NOTE — Patient Instructions (Signed)
We discussed the various reasons your cholesterol could be high including genetics, thyroid disorder, diet, need for exercise.  We are rechecking your cholesterol levels today.  If it still looks high and your risk of stroke or heart attack are high I would recommend starting the cholesterol medicine that I sent in previously.  I will call you and let you know if this is needed  I am also rechecking your thyroid level.  I am glad to continue prescribing your inhaler for asthma.  You do not have to see the asthma and allergy specialist unless you feel that symptoms are not well controlled.

## 2019-02-22 LAB — CMP14+EGFR
ALT: 10 IU/L (ref 0–32)
AST: 17 IU/L (ref 0–40)
Albumin/Globulin Ratio: 1.6 (ref 1.2–2.2)
Albumin: 4.2 g/dL (ref 3.8–4.8)
Alkaline Phosphatase: 63 IU/L (ref 39–117)
BUN/Creatinine Ratio: 13 (ref 12–28)
BUN: 14 mg/dL (ref 8–27)
Bilirubin Total: 0.4 mg/dL (ref 0.0–1.2)
CO2: 26 mmol/L (ref 20–29)
Calcium: 9.7 mg/dL (ref 8.7–10.3)
Chloride: 103 mmol/L (ref 96–106)
Creatinine, Ser: 1.07 mg/dL — ABNORMAL HIGH (ref 0.57–1.00)
GFR calc Af Amer: 62 mL/min/{1.73_m2} (ref >59)
GFR calc non Af Amer: 54 mL/min/{1.73_m2} — ABNORMAL LOW (ref >59)
Globulin, Total: 2.7 g/dL (ref 1.5–4.5)
Glucose: 90 mg/dL (ref 65–99)
Potassium: 4.5 mmol/L (ref 3.5–5.2)
Sodium: 142 mmol/L (ref 134–144)
Total Protein: 6.9 g/dL (ref 6.0–8.5)

## 2019-02-22 LAB — LIPID PANEL
Chol/HDL Ratio: 2.6 ratio (ref 0.0–4.4)
Cholesterol, Total: 236 mg/dL — ABNORMAL HIGH (ref 100–199)
HDL: 92 mg/dL (ref >39)
LDL Chol Calc (NIH): 135 mg/dL — ABNORMAL HIGH (ref 0–99)
Triglycerides: 52 mg/dL (ref 0–149)
VLDL Cholesterol Cal: 9 mg/dL (ref 5–40)

## 2019-02-22 LAB — THYROID PANEL WITH TSH
Free Thyroxine Index: 1.6 (ref 1.2–4.9)
T3 Uptake Ratio: 26 % (ref 24–39)
T4, Total: 6.3 ug/dL (ref 4.5–12.0)
TSH: 2.91 u[IU]/mL (ref 0.450–4.500)

## 2019-02-23 ENCOUNTER — Telehealth: Payer: Self-pay | Admitting: Family Medicine

## 2019-02-23 NOTE — Telephone Encounter (Signed)
Aware of results. 

## 2019-02-24 ENCOUNTER — Other Ambulatory Visit: Payer: Self-pay | Admitting: *Deleted

## 2019-02-24 DIAGNOSIS — E7800 Pure hypercholesterolemia, unspecified: Secondary | ICD-10-CM

## 2019-02-24 NOTE — Progress Notes (Signed)
This was already sent previously but she did not pick up.  Please make sure she calls to have them fill.

## 2019-02-24 NOTE — Progress Notes (Signed)
Left message for patient to call pharmacy to fill atorvastatin that was ordered in July.

## 2019-06-01 ENCOUNTER — Other Ambulatory Visit: Payer: Self-pay | Admitting: Family Medicine

## 2019-06-01 DIAGNOSIS — J45909 Unspecified asthma, uncomplicated: Secondary | ICD-10-CM

## 2019-06-08 ENCOUNTER — Ambulatory Visit: Payer: Medicare HMO

## 2019-06-12 ENCOUNTER — Ambulatory Visit (INDEPENDENT_AMBULATORY_CARE_PROVIDER_SITE_OTHER): Payer: Medicare HMO | Admitting: Family Medicine

## 2019-06-12 ENCOUNTER — Ambulatory Visit: Payer: Medicare HMO | Attending: Family

## 2019-06-12 DIAGNOSIS — I10 Essential (primary) hypertension: Secondary | ICD-10-CM

## 2019-06-12 DIAGNOSIS — Z780 Asymptomatic menopausal state: Secondary | ICD-10-CM | POA: Diagnosis not present

## 2019-06-12 DIAGNOSIS — E7800 Pure hypercholesterolemia, unspecified: Secondary | ICD-10-CM | POA: Diagnosis not present

## 2019-06-12 DIAGNOSIS — Z23 Encounter for immunization: Secondary | ICD-10-CM

## 2019-06-12 MED ORDER — ATORVASTATIN CALCIUM 20 MG PO TABS: 10.0000 mg | ORAL_TABLET | Freq: Every day | ORAL | 3 refills | Status: DC

## 2019-06-12 NOTE — Progress Notes (Signed)
Telephone visit  Subjective: CC: f/u HLD PCP: Janora Norlander, DO [id number] Kimberly Aguilar is a 69 y.o. female calls for telephone consult today. Patient provides verbal consent for consult held via phone.  Due to COVID-19 pandemic this visit was conducted virtually. This visit type was conducted due to national recommendations for restrictions regarding the COVID-19 Pandemic (e.g. social distancing, sheltering in place) in an effort to limit this patient's exposure and mitigate transmission in our community. All issues noted in this document were discussed and addressed.  A physical exam was not performed with this format.   Location of patient: home Location of provider: Working remotely from home Others present for call: none  1. Hyperlipidemia with hypertension She reports compliance with Lipitor 20 mg daily over the last several weeks.  She had been intermittently taking it because she was forgetting previously.  She does note some lower leg pain that was not present previously.  She also notes increased heart burn since starting the lipitor.  She is walking regularly and has arthritic pains in her hips and knees.  No weakness.  She wonders if she should start splitting up her walks and workouts so that she is not overdoing it.  She is compliant with her Norvasc.  Has not measured her blood pressure recently but notes that she has been feeling well.  No chest pain, shortness of breath, edema, dizziness or falls.  2.  Preventative care She is wondering when her next bone density scan is due.  She has not had one in several years.  She is up-to-date on mammogram, colonoscopy.   ROS: Per HPI  No Known Allergies Past Medical History:  Diagnosis Date  . Adenoma    PARATHYROID  . Asthma   . Colon polyp   . Hypercalcemia   . Hyperparathyroidism ([place]) 06/30/2013  . Hypertension   . Parathyroid adenoma 2015    Current Outpatient Medications:  .  amLODipine (NORVASC) 2.5 MG tablet, Take 1  tablet (2.5 mg total) by mouth every morning., Disp: 90 tablet, Rfl: 3 .  atorvastatin (LIPITOR) 20 MG tablet, Take 20 mg by mouth at bedtime., Disp: , Rfl:  .  BREO ELLIPTA 100-25 MCG/INH AEPB, USE 1 INHALATION DAILY, Disp: 60 each, Rfl: 2 .  Flaxseed, Linseed, (BL FLAX SEED OIL PO), Take 1 tablet by mouth daily. , Disp: , Rfl:   Assessment/ Plan: 69 y.o. female   1. Pure hypercholesterolemia Okay to reduce to 10 mg nightly and see if perhaps this resolves the symptoms she has been experiencing with the 20 mg dose of Lipitor.  Plan for fasting lipid panel and repeat LFTs in 3 months.  Appointment has been scheduled she is aware of date and time. - atorvastatin (LIPITOR) 20 MG tablet; Take 0.5 tablets (10 mg total) by mouth at bedtime.  Dispense: 45 tablet; Refill: 3  2. Essential hypertension Seemingly stable.  Has not been monitoring blood pressures.  No adverse symptoms.  3. Asymptomatic postmenopausal estrogen deficiency Plan for DEXA at her May visit.  We will see if we can coordinate this to be done on the same day. - DG [facility] DEXA; Future   Start time: 9:01am End time: 9:13am  Total time spent on patient care (including telephone call/ virtual visit): 20 minutes  [place], [place] [phone]

## 2019-06-12 NOTE — Progress Notes (Signed)
   Covid-19 Vaccination Clinic  Name:  Kimberly Aguilar    MRN: [id number] DOB: 01-Jun-1950  06/12/2019  Kimberly Aguilar was observed post Covid-19 immunization for 15 minutes without incidence. She was provided with Vaccine Information Sheet and instruction to access the V-Safe system.   Kimberly Aguilar was instructed to call 911 with any severe reactions post vaccine: Marland Kitchen Difficulty breathing  . Swelling of your face and throat  . A fast heartbeat  . A bad rash all over your body  . Dizziness and weakness    Immunizations Administered    Name Date Dose VIS Date Route   Moderna COVID-19 Vaccine 06/12/2019 11:15 AM 0.5 mL 03/21/2019 Intramuscular   Manufacturer: Moderna   Lot: [id number]   [id number]

## 2019-06-13 NOTE — Progress Notes (Signed)
Appointment has been made to following OV with Lajuana Ripple

## 2019-07-11 ENCOUNTER — Ambulatory Visit: Payer: Medicare HMO | Attending: Family

## 2019-07-11 DIAGNOSIS — Z23 Encounter for immunization: Secondary | ICD-10-CM

## 2019-07-11 NOTE — Progress Notes (Signed)
   Covid-19 Vaccination Clinic  Name:  Kimberly Aguilar    MRN: [id number] DOB: 30-Jun-1950  07/11/2019  Kimberly Aguilar was observed post Covid-19 immunization for 15 minutes without incident. She was provided with Vaccine Information Sheet and instruction to access the V-Safe system.   Kimberly Aguilar was instructed to call 911 with any severe reactions post vaccine: Marland Kitchen Difficulty breathing  . Swelling of face and throat  . A fast heartbeat  . A bad rash all over body  . Dizziness and weakness   Immunizations Administered    Name Date Dose VIS Date Route   Moderna COVID-19 Vaccine 07/11/2019  2:39 PM 0.5 mL 03/21/2019 Intramuscular   Manufacturer: Moderna   LotHQ:[id number]   [place]:[id number]

## 2019-07-18 DIAGNOSIS — H524 Presbyopia: Secondary | ICD-10-CM | POA: Diagnosis not present

## 2019-07-20 ENCOUNTER — Other Ambulatory Visit: Payer: Self-pay | Admitting: Family

## 2019-07-20 DIAGNOSIS — I10 Essential (primary) hypertension: Secondary | ICD-10-CM

## 2019-08-31 DIAGNOSIS — H40013 Open angle with borderline findings, low risk, bilateral: Secondary | ICD-10-CM | POA: Diagnosis not present

## 2019-09-04 ENCOUNTER — Other Ambulatory Visit: Payer: Self-pay | Admitting: Family Medicine

## 2019-09-04 DIAGNOSIS — J45909 Unspecified asthma, uncomplicated: Secondary | ICD-10-CM

## 2019-09-13 ENCOUNTER — Ambulatory Visit (INDEPENDENT_AMBULATORY_CARE_PROVIDER_SITE_OTHER): Payer: Medicare HMO | Admitting: Family Medicine

## 2019-09-13 ENCOUNTER — Encounter: Payer: Self-pay | Admitting: Family Medicine

## 2019-09-13 ENCOUNTER — Other Ambulatory Visit: Payer: Medicare HMO

## 2019-09-13 ENCOUNTER — Other Ambulatory Visit: Payer: Self-pay

## 2019-09-13 VITALS — BP 128/71 | HR 66 | Temp 97.4°F | Ht 62.0 in | Wt 137.6 lb

## 2019-09-13 DIAGNOSIS — R7989 Other specified abnormal findings of blood chemistry: Secondary | ICD-10-CM | POA: Diagnosis not present

## 2019-09-13 DIAGNOSIS — N898 Other specified noninflammatory disorders of vagina: Secondary | ICD-10-CM

## 2019-09-13 DIAGNOSIS — N39490 Overflow incontinence: Secondary | ICD-10-CM

## 2019-09-13 DIAGNOSIS — Z0001 Encounter for general adult medical examination with abnormal findings: Secondary | ICD-10-CM

## 2019-09-13 DIAGNOSIS — E7800 Pure hypercholesterolemia, unspecified: Secondary | ICD-10-CM | POA: Diagnosis not present

## 2019-09-13 DIAGNOSIS — Z0000 Encounter for general adult medical examination without abnormal findings: Secondary | ICD-10-CM

## 2019-09-13 LAB — WET PREP FOR TRICH, YEAST, CLUE
Clue Cell Exam: NEGATIVE
Trichomonas Exam: NEGATIVE
Yeast Exam: NEGATIVE

## 2019-09-13 NOTE — Patient Instructions (Signed)
You had labs performed today.  You will be contacted with the results of the labs once they are available, usually in the next 3 business days for routine lab work.  If you have an active my chart account, they will be released to your MyChart.  If you prefer to have these labs released to you via telephone, please let us know.  If you had a pap smear or biopsy performed, expect to be contacted in about 7-10 days.   Preventive Care 4 Years and Older, Female Preventive care refers to lifestyle choices and visits with your health care provider that can promote health and wellness. This includes:  A yearly physical exam. This is also called an annual well check.  Regular dental and eye exams.  Immunizations.  Screening for certain conditions.  Healthy lifestyle choices, such as diet and exercise. What can I expect for my preventive care visit? Physical exam Your health care provider will check:  Height and weight. These may be used to calculate body mass index (BMI), which is a measurement that tells if you are at a healthy weight.  Heart rate and blood pressure.  Your skin for abnormal spots. Counseling Your health care provider may ask you questions about:  Alcohol, tobacco, and drug use.  Emotional well-being.  Home and relationship well-being.  Sexual activity.  Eating habits.  History of falls.  Memory and ability to understand (cognition).  Work and work Statistician.  Pregnancy and menstrual history. What immunizations do I need?  Influenza (flu) vaccine  This is recommended every year. Tetanus, diphtheria, and pertussis (Tdap) vaccine  You may need a Td booster every 10 years. Varicella (chickenpox) vaccine  You may need this vaccine if you have not already been vaccinated. Zoster (shingles) vaccine  You may need this after age 4. Pneumococcal conjugate (PCV13) vaccine  One dose is recommended after age 56. Pneumococcal polysaccharide (PPSV23)  vaccine  One dose is recommended after age 11. Measles, mumps, and rubella (MMR) vaccine  You may need at least one dose of MMR if you were born in 1957 or later. You may also need a second dose. Meningococcal conjugate (MenACWY) vaccine  You may need this if you have certain conditions. Hepatitis A vaccine  You may need this if you have certain conditions or if you travel or work in places where you may be exposed to hepatitis A. Hepatitis B vaccine  You may need this if you have certain conditions or if you travel or work in places where you may be exposed to hepatitis B. Haemophilus influenzae type b (Hib) vaccine  You may need this if you have certain conditions. You may receive vaccines as individual doses or as more than one vaccine together in one shot (combination vaccines). Talk with your health care provider about the risks and benefits of combination vaccines. What tests do I need? Blood tests  Lipid and cholesterol levels. These may be checked every 5 years, or more frequently depending on your overall health.  Hepatitis C test.  Hepatitis B test. Screening  Lung cancer screening. You may have this screening every year starting at age 86 if you have a 30-pack-year history of smoking and currently smoke or have quit within the past 15 years.  Colorectal cancer screening. All adults should have this screening starting at age 64 and continuing until age 96. Your health care provider may recommend screening at age 42 if you are at increased risk. You will have tests every 1-10  years, depending on your results and the type of screening test.  Diabetes screening. This is done by checking your blood sugar (glucose) after you have not eaten for a while (fasting). You may have this done every 1-3 years.  Mammogram. This may be done every 1-2 years. Talk with your health care provider about how often you should have regular mammograms.  BRCA-related cancer screening. This may  be done if you have a family history of breast, ovarian, tubal, or peritoneal cancers. Other tests  Sexually transmitted disease (STD) testing.  Bone density scan. This is done to screen for osteoporosis. You may have this done starting at age 50. Follow these instructions at home: Eating and drinking  Eat a diet that includes fresh fruits and vegetables, whole grains, lean protein, and low-fat dairy products. Limit your intake of foods with high amounts of sugar, saturated fats, and salt.  Take vitamin and mineral supplements as recommended by your health care provider.  Do not drink alcohol if your health care provider tells you not to drink.  If you drink alcohol: ? Limit how much you have to 0-1 drink a day. ? Be aware of how much alcohol is in your drink. In the [place] one drink equals one 12 oz bottle of beer (355 mL), one 5 oz glass of wine (148 mL), or one 1 oz glass of hard liquor (44 mL). Lifestyle  Take daily care of your teeth and gums.  Stay active. Exercise for at least 30 minutes on 5 or more days each week.  Do not use any products that contain nicotine or tobacco, such as cigarettes, e-cigarettes, and chewing tobacco. If you need help quitting, ask your health care provider.  If you are sexually active, practice safe sex. Use a condom or other form of protection in order to prevent STIs (sexually transmitted infections).  Talk with your health care provider about taking a low-dose aspirin or statin. What's next?  Go to your health care provider once a year for a well check visit.  Ask your health care provider how often you should have your eyes and teeth checked.  Stay up to date on all vaccines. This information is not intended to replace advice given to you by your health care provider. Make sure you discuss any questions you have with your health care provider. Document Revised: 03/31/2018 Document Reviewed: 03/31/2018 Elsevier Patient Education  2020  [employer].

## 2019-09-13 NOTE — Progress Notes (Signed)
Kimberly Aguilar is a 69 y.o. female presents to office today for annual physical exam examination.    Concerns today include: 1. Vaginal discharge She reports "leakage" that has been ongoing for about 6 months.  Denies color, odor to discharge.  No itching, burning.  Not sexually active.  She has delivered children vaginally.  She denies stress incontinence.  She sometimes has urge incontinence/ overflow incontinence.    Substance use: none Diet: balanced, Exercise: regular Last eye exam: UTD Last dental exam: UTD Last colonoscopy: UTD (had a LB) due in 2 year Last mammogram: UTD Last pap smear: n/a  Past Medical History:  Diagnosis Date  . Adenoma    PARATHYROID  . Asthma   . Colon polyp   . Hypercalcemia   . Hyperparathyroidism ([place]) 06/30/2013  . Hypertension   . Parathyroid adenoma 02-Jun-2013   Social History   Socioeconomic History  . Marital status: Widowed    Spouse name: Not on file  . Number of children: 2  . Years of education: Not on file  . Highest education level: Not on file  Occupational History  . Not on file  Tobacco Use  . Smoking status: Former Smoker    Types: Cigarettes    Quit date: 03/31/1973    Years since quitting: 46.4  . Smokeless tobacco: Never Used  Substance and Sexual Activity  . Alcohol use: No  . Drug use: No  . Sexual activity: Not on file  Other Topics Concern  . Not on file  Social History Narrative   Ms Ditmer has a daughter who resides in [place]   She had a son, who was disabled after he sustained TBI 2/2 meningitis, but he passed away June 02, 2017.   Social Determinants of Health   Financial Resource Strain:   . Difficulty of Paying Living Expenses:   Food Insecurity:   . Worried About Charity fundraiser in the Last Year:   . Arboriculturist in the Last Year:   Transportation Needs:   . Film/video editor (Medical):   Marland Kitchen Lack of Transportation (Non-Medical):   Physical Activity:   . Days of Exercise per Week:   .  Minutes of Exercise per Session:   Stress:   . Feeling of Stress :   Social Connections:   . Frequency of Communication with Friends and Family:   . Frequency of Social Gatherings with Friends and Family:   . Attends Religious Services:   . Active Member of Clubs or Organizations:   . Attends Archivist Meetings:   Marland Kitchen Marital Status:   Intimate Partner Violence:   . Fear of Current or Ex-Partner:   . Emotionally Abused:   Marland Kitchen Physically Abused:   . Sexually Abused:    Past Surgical History:  Procedure Laterality Date  . COLONOSCOPY  2010/06/02  . cyst removal from left breast    . PARATHYROIDECTOMY Right 08/07/2013   Procedure: PARATHYROIDECTOMY;  Surgeon: [name], MD;  Location: [facility];  Service: General;  Laterality: Right;   Family History  Problem Relation Age of Onset  . Hypertension Mother   . Heart disease Mother   . Hypertension Father   . Congestive Heart Failure Brother   . Heart disease Brother   . Heart disease Brother   . Diabetes Brother   . Diabetes Sister   . Seizures Brother   . Cancer Sister 48       Uterine Cancer had hysterectomy  . Hypertension  Brother   . Hyperlipidemia Brother   . Seizures Son   . Colon cancer Neg Hx     Current Outpatient Medications:  .  amLODipine (NORVASC) 2.5 MG tablet, TAKE ONE TABLET EVERY MORNING, Disp: 90 tablet, Rfl: 0 .  atorvastatin (LIPITOR) 20 MG tablet, Take 0.5 tablets (10 mg total) by mouth at bedtime., Disp: 45 tablet, Rfl: 3 .  BREO ELLIPTA 100-25 MCG/INH AEPB, USE 1 INHALATION DAILY, Disp: 60 each, Rfl: 0 .  Flaxseed, Linseed, (BL FLAX SEED OIL PO), Take 1 tablet by mouth daily. , Disp: , Rfl:   No Known Allergies   ROS: Review of Systems Pertinent items noted in HPI and remainder of comprehensive ROS otherwise negative.    Physical exam BP 128/71   Pulse 66   Temp (!) 97.4 F (36.3 C)   Ht _0  (1.575 m)   Wt 137 lb 9.6 oz (62.4 kg)   SpO2 98%   BMI 25.17 kg/m  General appearance:  alert, cooperative, appears stated age and no distress Head: Normocephalic, without obvious abnormality, atraumatic Eyes: negative findings: lids and lashes normal, conjunctivae and sclerae normal, corneas clear and pupils equal, round, reactive to light and accomodation; arcus senilis noted Ears: normal TM's and external ear canals both ears Nose: Nares normal. Septum midline. Mucosa normal. No drainage or sinus tenderness. Throat: lips, mucosa, and tongue normal; teeth and gums normal Neck: no adenopathy, no carotid bruit, supple, symmetrical, trachea midline and thyroid not enlarged, symmetric, no tenderness/mass/nodules Back: symmetric, no curvature. ROM normal. No CVA tenderness. Lungs: clear to auscultation bilaterally Heart: regular rate and rhythm, S1, S2 normal, no murmur, click, rub or gallop Abdomen: soft, non-tender; bowel sounds normal; no masses,  no organomegaly Pelvic: external genitalia normal, no adnexal masses or tenderness, no cervical motion tenderness, rectovaginal septum normal, uterus normal size, shape, and consistency, vagina normal without discharge and No appreciable cystocele is noted.  Cervix is midline and with mild sclerotic changes but nothing significantly abnormal. Extremities: extremities normal, atraumatic, no cyanosis or edema Pulses: 2+ and symmetric Skin: Skin color, texture, turgor normal. No rashes or lesions Lymph nodes: Cervical, supraclavicular, and axillary nodes normal. Neurologic: Alert and oriented X 3, normal strength and tone. Normal symmetric reflexes. Normal coordination and gait Psych: Mood stable, speech normal, affect appropriate, pleasant and interactive   Assessment/ Plan: Kimberly Aguilar here for annual physical exam.   1. Annual physical exam  2. Pure hypercholesterolemia Repeat fasting lipid panel.  She has been off of her medication for about 2 to 3 weeks.  I encouraged her to go back on medicine.  Okay to take with blood pressure  medicine each morning - CMP14+EGFR - Lipid Panel - TSH  3. Elevated serum creatinine GFR has been in range for patient but will recheck - CMP14+EGFR  4. Vaginal discharge No evidence of infection. - WET PREP FOR [place], YEAST, CLUE  5. Overflow incontinence of urine Kegel exercises reinforced.  No need for medication at this point    Handout on healthy lifestyle choices, including diet (rich in fruits, vegetables and lean meats and low in salt and simple carbohydrates) and exercise (at least 30 minutes of moderate physical activity daily).  Patient to follow up in 1 year for annual exam or sooner if needed.  Haroun Cotham M. Lajuana Ripple, DO

## 2019-09-14 LAB — LIPID PANEL
Chol/HDL Ratio: 2.3 ratio (ref 0.0–4.4)
Cholesterol, Total: 228 mg/dL — ABNORMAL HIGH (ref 100–199)
HDL: 99 mg/dL (ref >39)
LDL Chol Calc (NIH): 122 mg/dL — ABNORMAL HIGH (ref 0–99)
Triglycerides: 43 mg/dL (ref 0–149)
VLDL Cholesterol Cal: 7 mg/dL (ref 5–40)

## 2019-09-14 LAB — CMP14+EGFR
ALT: 11 IU/L (ref 0–32)
AST: 17 IU/L (ref 0–40)
Albumin/Globulin Ratio: 1.7 (ref 1.2–2.2)
Albumin: 4.4 g/dL (ref 3.8–4.8)
Alkaline Phosphatase: 67 IU/L (ref 48–121)
BUN/Creatinine Ratio: 16 (ref 12–28)
BUN: 17 mg/dL (ref 8–27)
Bilirubin Total: 0.4 mg/dL (ref 0.0–1.2)
CO2: 24 mmol/L (ref 20–29)
Calcium: 9.6 mg/dL (ref 8.7–10.3)
Chloride: 101 mmol/L (ref 96–106)
Creatinine, Ser: 1.09 mg/dL — ABNORMAL HIGH (ref 0.57–1.00)
GFR calc Af Amer: 60 mL/min/{1.73_m2} (ref >59)
GFR calc non Af Amer: 52 mL/min/{1.73_m2} — ABNORMAL LOW (ref >59)
Globulin, Total: 2.6 g/dL (ref 1.5–4.5)
Glucose: 94 mg/dL (ref 65–99)
Potassium: 4 mmol/L (ref 3.5–5.2)
Sodium: 139 mmol/L (ref 134–144)
Total Protein: 7 g/dL (ref 6.0–8.5)

## 2019-09-14 LAB — TSH: TSH: 2.77 u[IU]/mL (ref 0.450–4.500)

## 2019-10-06 ENCOUNTER — Other Ambulatory Visit: Payer: Self-pay | Admitting: Family Medicine

## 2019-10-06 DIAGNOSIS — J45909 Unspecified asthma, uncomplicated: Secondary | ICD-10-CM

## 2019-10-25 DIAGNOSIS — R69 Illness, unspecified: Secondary | ICD-10-CM | POA: Diagnosis not present

## 2019-10-26 ENCOUNTER — Other Ambulatory Visit: Payer: Self-pay | Admitting: Family Medicine

## 2019-10-26 DIAGNOSIS — I10 Essential (primary) hypertension: Secondary | ICD-10-CM

## 2019-11-06 DIAGNOSIS — R69 Illness, unspecified: Secondary | ICD-10-CM | POA: Diagnosis not present

## 2020-01-05 ENCOUNTER — Other Ambulatory Visit: Payer: Self-pay | Admitting: Family Medicine

## 2020-01-05 DIAGNOSIS — J45909 Unspecified asthma, uncomplicated: Secondary | ICD-10-CM

## 2020-01-08 ENCOUNTER — Other Ambulatory Visit (HOSPITAL_COMMUNITY): Payer: Self-pay | Admitting: Family Medicine

## 2020-01-08 DIAGNOSIS — Z1231 Encounter for screening mammogram for malignant neoplasm of breast: Secondary | ICD-10-CM

## 2020-02-10 IMAGING — MG DIGITAL SCREENING BILATERAL MAMMOGRAM WITH TOMO AND CAD
8 series · 9 of 24 positions shown · non-contrast
Comparison: Previous exam(s).

CLINICAL DATA: Screening.

EXAM:
DIGITAL SCREENING BILATERAL MAMMOGRAM WITH TOMO AND CAD

[R CC synth-2D]
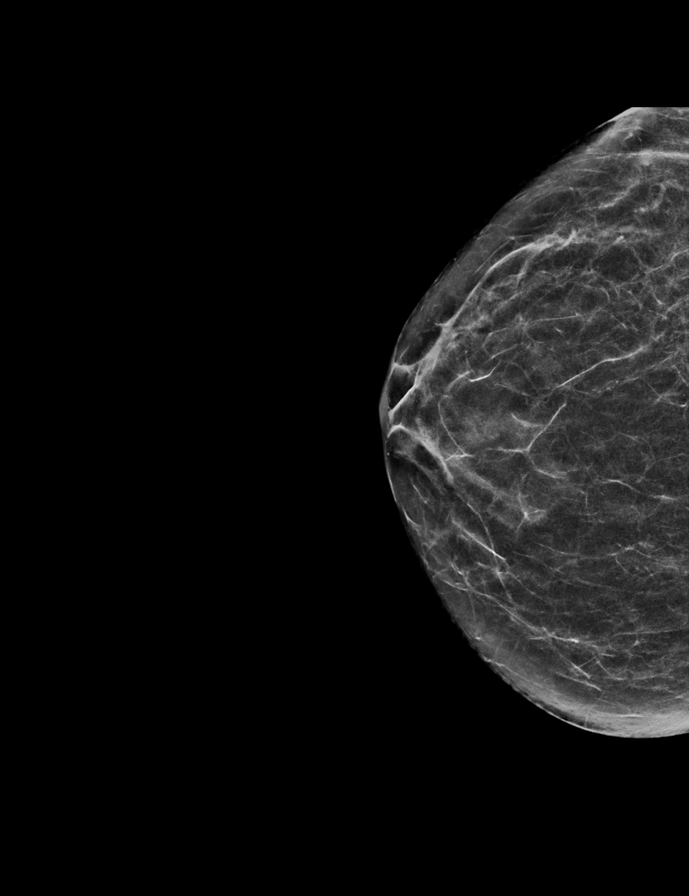

[R MLO synth-2D]
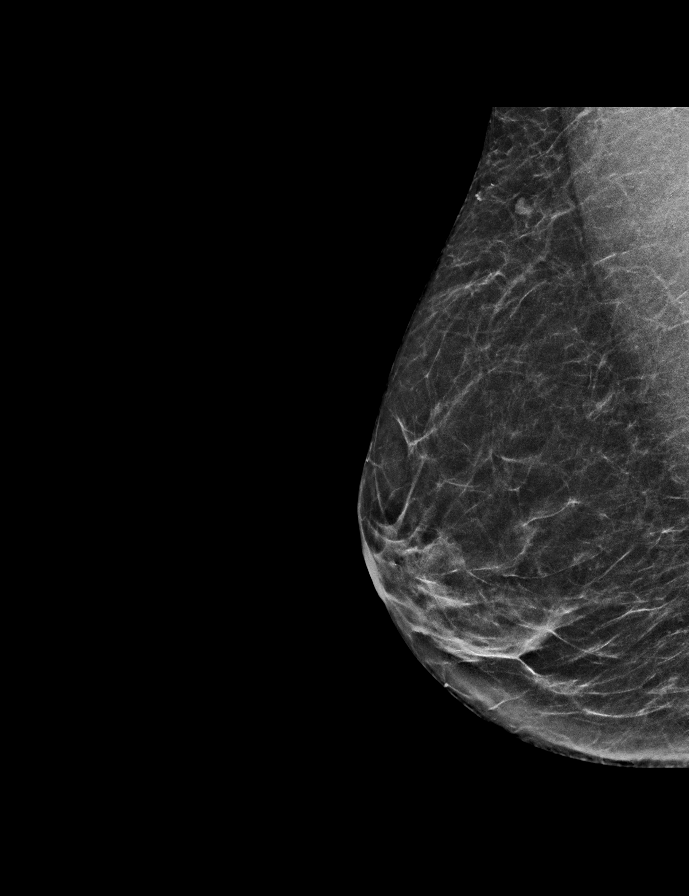

[L MLO synth-2D]
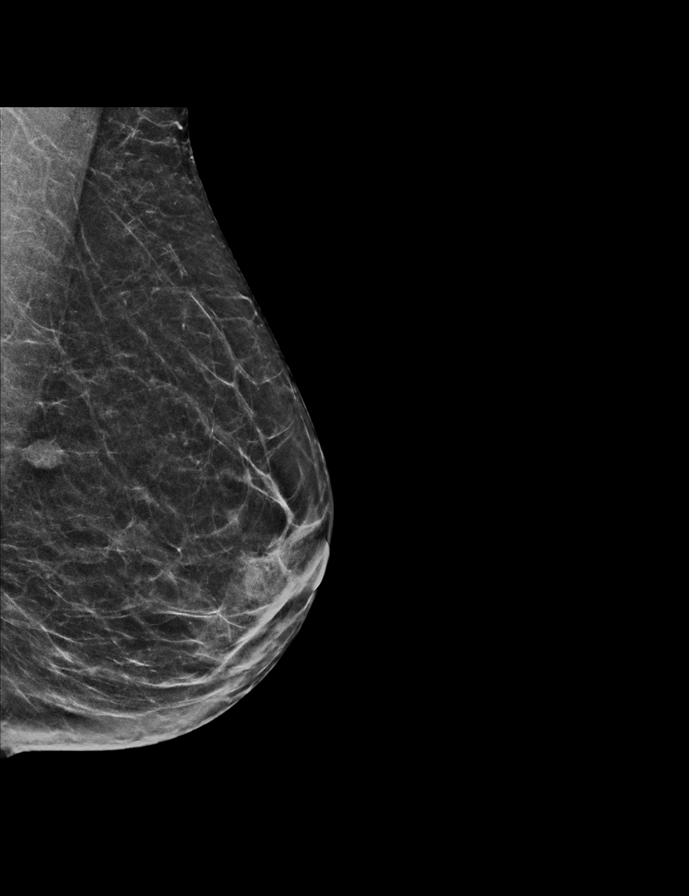

[L CC synth-2D]
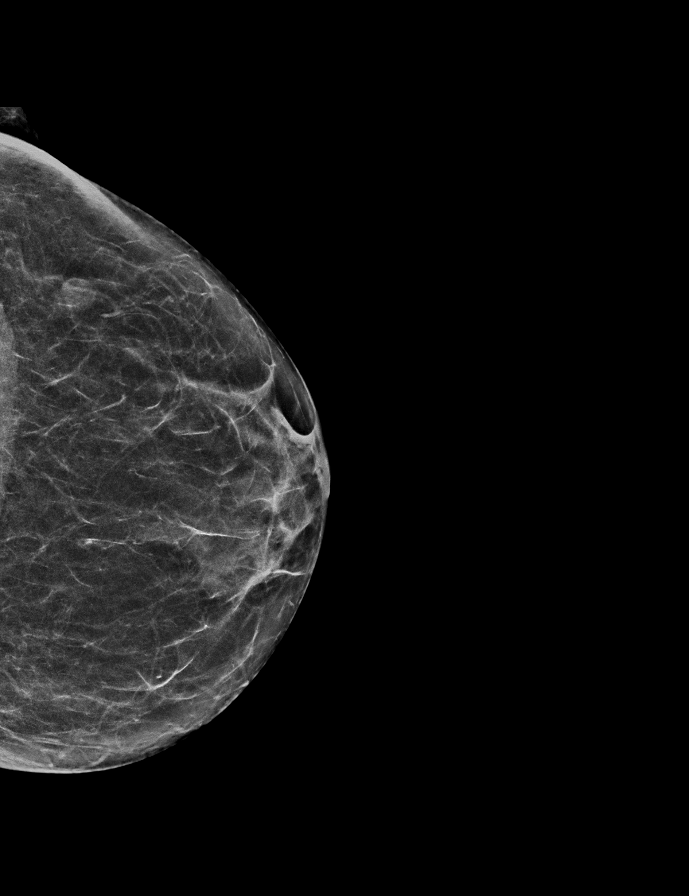

[L CC tomo · 2 of 50 frames shown]
[frame 17/50]
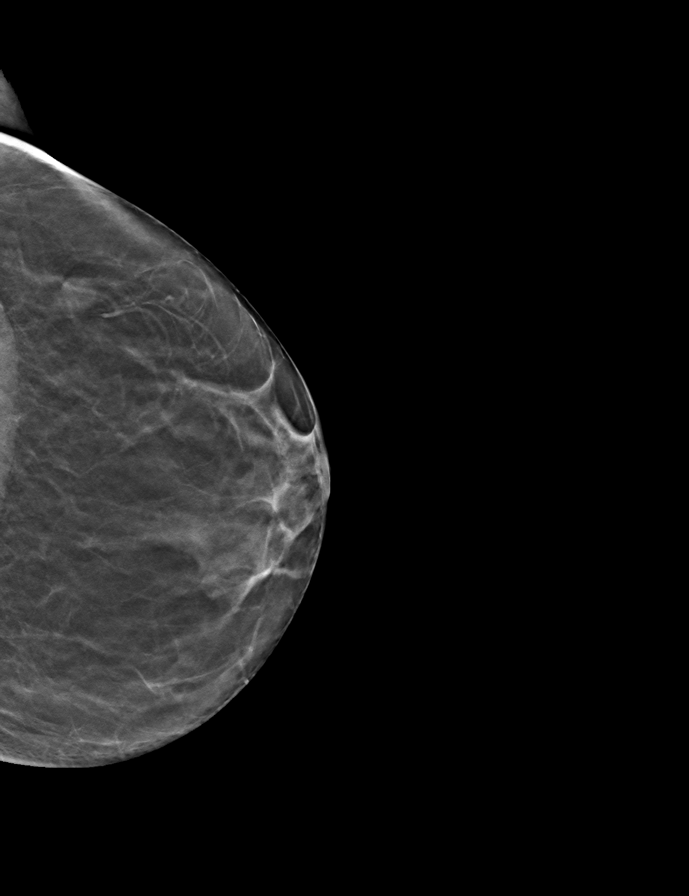
[frame 25/50]
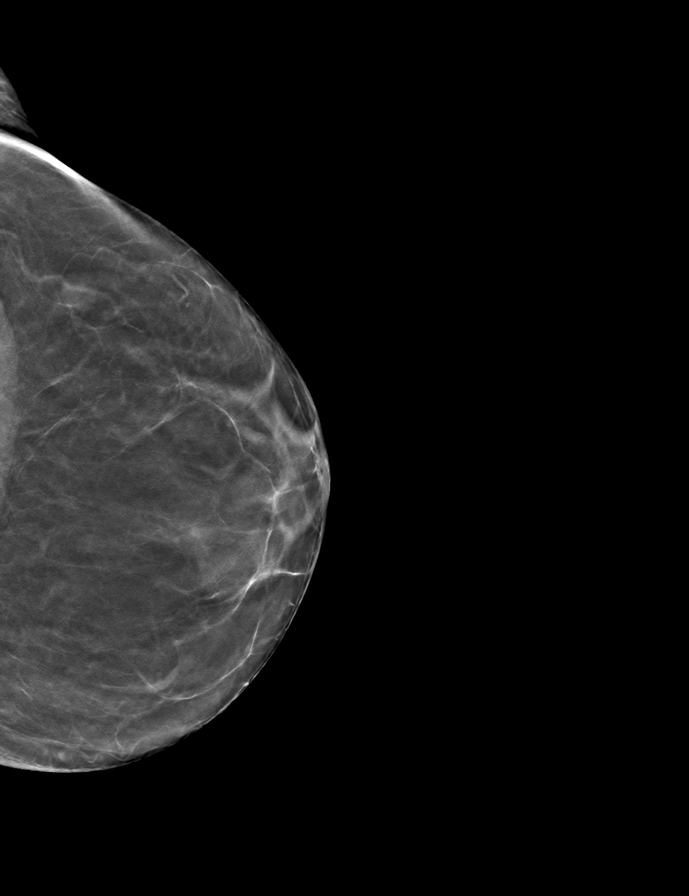

[R CC tomo · tomo slice 26/51.0]
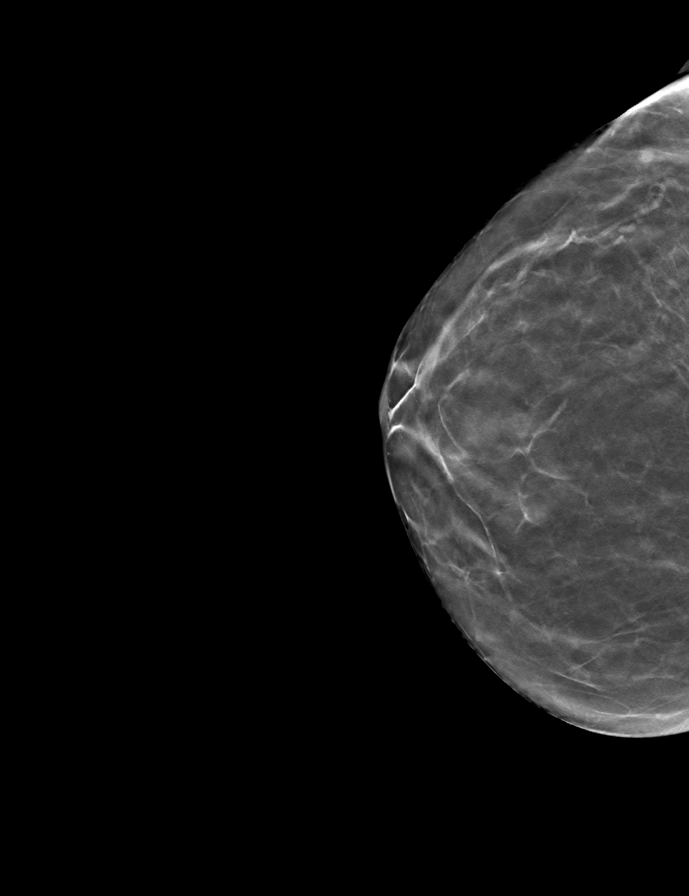

[L MLO tomo · tomo slice 25/48.0]
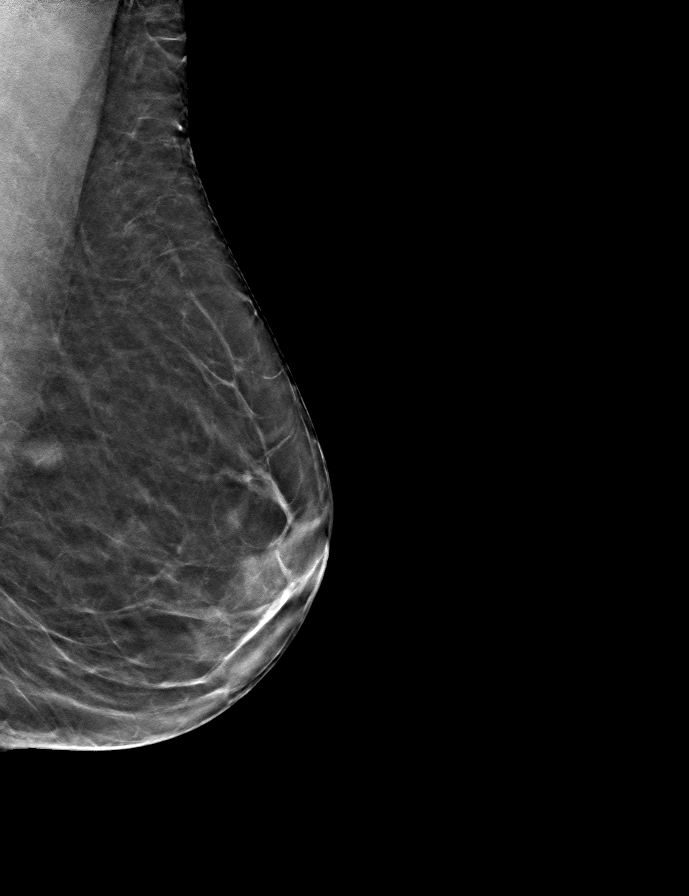

[R MLO tomo · tomo slice 26/51.0]
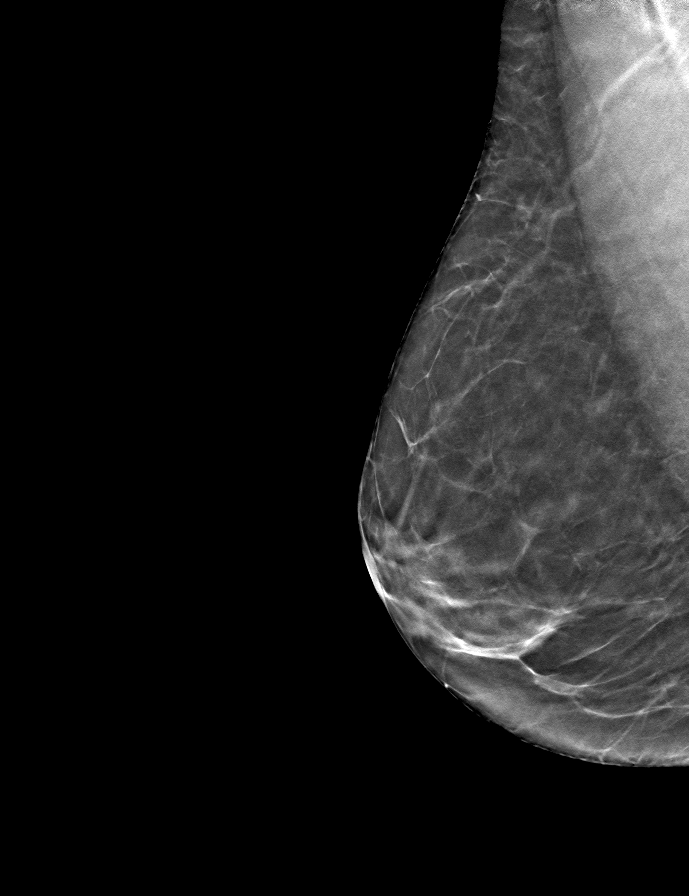

[9 of 24 positions shown; findings below may reference images not displayed]

ACR Breast Density Category b: There are scattered areas of
fibroglandular density.
FINDINGS: There are no findings suspicious for malignancy. Images were
processed with CAD.
IMPRESSION: No mammographic evidence of malignancy. A result letter of this
screening mammogram will be mailed directly to the patient.

RECOMMENDATION:
Screening mammogram in one year. (Code:[id number])

BI-RADS CATEGORY  1: Negative.

## 2020-02-12 ENCOUNTER — Ambulatory Visit (HOSPITAL_COMMUNITY)
Admission: RE | Admit: 2020-02-12 | Discharge: 2020-02-12 | Disposition: A | Discharge status: Home / Self Care | Payer: Medicare HMO | Source: Ambulatory Visit | Attending: Family Medicine | Admitting: Family Medicine

## 2020-02-12 ENCOUNTER — Other Ambulatory Visit: Payer: Self-pay

## 2020-02-12 DIAGNOSIS — Z1231 Encounter for screening mammogram for malignant neoplasm of breast: Secondary | ICD-10-CM

## 2020-02-26 DIAGNOSIS — H40013 Open angle with borderline findings, low risk, bilateral: Secondary | ICD-10-CM | POA: Diagnosis not present

## 2020-03-07 ENCOUNTER — Other Ambulatory Visit: Payer: Self-pay | Admitting: Family Medicine

## 2020-03-07 DIAGNOSIS — J45909 Unspecified asthma, uncomplicated: Secondary | ICD-10-CM

## 2020-03-13 ENCOUNTER — Other Ambulatory Visit: Payer: Medicare HMO

## 2020-03-13 ENCOUNTER — Ambulatory Visit: Payer: Medicare HMO | Admitting: Family Medicine

## 2020-03-19 ENCOUNTER — Encounter: Payer: Self-pay | Admitting: Family Medicine

## 2020-03-25 DIAGNOSIS — R69 Illness, unspecified: Secondary | ICD-10-CM | POA: Diagnosis not present

## 2020-04-09 ENCOUNTER — Other Ambulatory Visit: Payer: Self-pay | Admitting: Family Medicine

## 2020-04-09 DIAGNOSIS — J45909 Unspecified asthma, uncomplicated: Secondary | ICD-10-CM

## 2020-04-18 ENCOUNTER — Other Ambulatory Visit: Payer: Self-pay | Admitting: Family Medicine

## 2020-04-18 DIAGNOSIS — I10 Essential (primary) hypertension: Secondary | ICD-10-CM

## 2020-04-25 ENCOUNTER — Encounter: Payer: Self-pay | Admitting: Family Medicine

## 2020-04-25 ENCOUNTER — Ambulatory Visit (INDEPENDENT_AMBULATORY_CARE_PROVIDER_SITE_OTHER): Payer: Medicare HMO | Admitting: Family Medicine

## 2020-04-25 ENCOUNTER — Other Ambulatory Visit: Payer: Self-pay

## 2020-04-25 VITALS — BP 133/74 | HR 81 | Temp 97.7°F | Ht 62.0 in | Wt 128.0 lb

## 2020-04-25 DIAGNOSIS — F4321 Adjustment disorder with depressed mood: Secondary | ICD-10-CM | POA: Diagnosis not present

## 2020-04-25 DIAGNOSIS — I10 Essential (primary) hypertension: Secondary | ICD-10-CM

## 2020-04-25 DIAGNOSIS — M62838 Other muscle spasm: Secondary | ICD-10-CM | POA: Diagnosis not present

## 2020-04-25 DIAGNOSIS — R7989 Other specified abnormal findings of blood chemistry: Secondary | ICD-10-CM | POA: Diagnosis not present

## 2020-04-25 DIAGNOSIS — R69 Illness, unspecified: Secondary | ICD-10-CM | POA: Diagnosis not present

## 2020-04-25 DIAGNOSIS — S76012A Strain of muscle, fascia and tendon of left hip, initial encounter: Secondary | ICD-10-CM | POA: Diagnosis not present

## 2020-04-25 DIAGNOSIS — E7800 Pure hypercholesterolemia, unspecified: Secondary | ICD-10-CM | POA: Diagnosis not present

## 2020-04-25 DIAGNOSIS — Z634 Disappearance and death of family member: Secondary | ICD-10-CM

## 2020-04-25 MED ORDER — TIZANIDINE HCL 4 MG PO TABS: 2.0000 mg | ORAL_TABLET | Freq: Three times a day (TID) | ORAL | 0 refills | Status: DC | PRN

## 2020-04-25 NOTE — Progress Notes (Signed)
Subjective: CC: Follow-up hyperlipidemia, renal function PCP: Janora Norlander, DO MPN:TIRW Kimberly Aguilar is a 70 y.o. female presenting to clinic today for:  1.  Hyperlipidemia Patient is compliant with her Lipitor 20 mg daily. No chest pain, shortness of breath.  Has been very physically active.  She works out daily with her friend.  2. hypertension with elevated serum creatinine Patient is compliant with her Norvasc 2.5 mg daily.  Again no reports of chest pain, shortness of breath.  3. grief Patient lost her daughter to [id number] complications in October.  She seems to be coping well with this but is intermittently tearful.  She does have some down days but tries to stay upbeat and spend time with her grandchildren.  4. hip and neck pain Patient reports left-sided hip pain and neck pain.  These symptoms are intermittent and sometimes exacerbated by certain activities.  She reports that yesterday she had difficulty turning her head to the left comfortably but today she seems to be doing okay.  He has a little bit of Tylenol which was helpful but no other medications or treatments.  Has had her friend give her massage in the past.   ROS: Per HPI  No Known Allergies Past Medical History:  Diagnosis Date  . Adenoma    PARATHYROID  . Asthma   . Colon polyp   . Hypercalcemia   . Hyperparathyroidism ([place]) 06/30/2013  . Hypertension   . Parathyroid adenoma 2015    Current Outpatient Medications:  .  amLODipine (NORVASC) 2.5 MG tablet, TAKE ONE TABLET EVERY MORNING, Disp: 90 tablet, Rfl: 0 .  atorvastatin (LIPITOR) 20 MG tablet, Take 0.5 tablets (10 mg total) by mouth at bedtime., Disp: 45 tablet, Rfl: 3 .  BREO ELLIPTA 100-25 MCG/INH AEPB, USE 1 INHALATION DAILY, Disp: 60 each, Rfl: 2 .  Flaxseed, Linseed, (BL FLAX SEED OIL PO), Take 1 tablet by mouth daily. , Disp: , Rfl:  Social History   Socioeconomic History  . Marital status: Widowed    Spouse name: Not on file  . Number of  children: 2  . Years of education: Not on file  . Highest education level: Not on file  Occupational History  . Not on file  Tobacco Use  . Smoking status: Former Smoker    Types: Cigarettes    Quit date: 03/31/1973    Years since quitting: 47.1  . Smokeless tobacco: Never Used  Vaping Use  . Vaping Use: Never used  Substance and Sexual Activity  . Alcohol use: No  . Drug use: No  . Sexual activity: Not on file  Other Topics Concern  . Not on file  Social History Narrative   Ms Capek has a daughter who resides in [place]   She had a son, who was disabled after he sustained TBI 2/2 meningitis, but he passed away 07-07-17.   Social Determinants of Health   Financial Resource Strain: Not on file  Food Insecurity: Not on file  Transportation Needs: Not on file  Physical Activity: Not on file  Stress: Not on file  Social Connections: Not on file  Intimate Partner Violence: Not on file   Family History  Problem Relation Age of Onset  . Hypertension Mother   . Heart disease Mother   . Hypertension Father   . Congestive Heart Failure Brother   . Heart disease Brother   . Heart disease Brother   . Diabetes Brother   . Diabetes Sister   . Seizures  Brother   . Cancer Sister 30       Uterine Cancer had hysterectomy  . Hypertension Brother   . Hyperlipidemia Brother   . Seizures Son   . Colon cancer Neg Hx     Objective: Office vital signs reviewed. BP 133/74   Pulse 81   Temp 97.7 F (36.5 C) (Temporal)   Ht 5' 2" (1.575 m)   Wt 128 lb (58.1 kg)   SpO2 99%   BMI 23.41 kg/m   Physical Examination:  General: Awake, alert, well nourished, No acute distress Cardio: regular rate   Pulm: Normal work of breathing on room air Extremities: warm, well perfused, No edema, cyanosis or clubbing; +2 pulses bilaterally MSK: Gait is normal.  C-spine: She has full active range of motion in all planes.  No midline tenderness palpation.  No paraspinal muscle tenderness  palpation.  No gross abnormalities  Hip: She has full strength in all planes of the left hip.  She does have what appears to be a slightly anterior hip on the left compared to the right.  ASIS on the left is inferior compared to the right Psych: Intermittently tearful when talking about her daughter.  Assessment/ Plan: 70 y.o. female   Pure hypercholesterolemia - Plan: CMP14+EGFR, Lipid Panel  Primary hypertension  Elevated serum creatinine - Plan: CMP14+EGFR  Grief at loss of child  Hip strain, left, initial encounter  Neck muscle spasm  Check lipid, renal function.  Blood pressures controlled.  Continue current regimen  She seems to be coping with the grief of the loss of her daughter from [place] this past fall.  I think that the left hip is likely a strain.  We discussed osteopathic maneuver for stretching of that left hip.  Her neck did not appear to be in significant spasm but she has been having this intermittently.  A written prescription for Zanaflex was prescribed.  I have given her home physical therapy stretches to do as well as encouraged use of heat.  Orders Placed This Encounter  Procedures  . CMP14+EGFR  . Lipid Panel   No orders of the defined types were placed in this encounter.    Janora Norlander, DO [place] [phone]

## 2020-04-26 LAB — CMP14+EGFR
ALT: 11 IU/L (ref 0–32)
AST: 13 IU/L (ref 0–40)
Albumin/Globulin Ratio: 1.6 (ref 1.2–2.2)
Albumin: 4.3 g/dL (ref 3.8–4.8)
Alkaline Phosphatase: 72 IU/L (ref 44–121)
BUN/Creatinine Ratio: 13 (ref 12–28)
BUN: 14 mg/dL (ref 8–27)
Bilirubin Total: 0.4 mg/dL (ref 0.0–1.2)
CO2: 27 mmol/L (ref 20–29)
Calcium: 9.6 mg/dL (ref 8.7–10.3)
Chloride: 102 mmol/L (ref 96–106)
Creatinine, Ser: 1.06 mg/dL — ABNORMAL HIGH (ref 0.57–1.00)
GFR calc Af Amer: 62 mL/min/{1.73_m2} (ref >59)
GFR calc non Af Amer: 54 mL/min/{1.73_m2} — ABNORMAL LOW (ref >59)
Globulin, Total: 2.7 g/dL (ref 1.5–4.5)
Glucose: 98 mg/dL (ref 65–99)
Potassium: 4.2 mmol/L (ref 3.5–5.2)
Sodium: 140 mmol/L (ref 134–144)
Total Protein: 7 g/dL (ref 6.0–8.5)

## 2020-04-26 LAB — LIPID PANEL
Chol/HDL Ratio: 2.1 ratio (ref 0.0–4.4)
Cholesterol, Total: 194 mg/dL (ref 100–199)
HDL: 91 mg/dL (ref >39)
LDL Chol Calc (NIH): 92 mg/dL (ref 0–99)
Triglycerides: 57 mg/dL (ref 0–149)
VLDL Cholesterol Cal: 11 mg/dL (ref 5–40)

## 2020-05-24 ENCOUNTER — Other Ambulatory Visit: Payer: Medicare HMO

## 2020-05-24 DIAGNOSIS — Z20822 Contact with and (suspected) exposure to covid-19: Secondary | ICD-10-CM | POA: Diagnosis not present

## 2020-05-25 LAB — SARS-COV-2, NAA 2 DAY TAT

## 2020-05-25 LAB — NOVEL CORONAVIRUS, NAA: SARS-CoV-2, NAA: NOT DETECTED

## 2020-05-27 ENCOUNTER — Encounter: Payer: Self-pay | Admitting: Family Medicine

## 2020-07-13 ENCOUNTER — Other Ambulatory Visit: Payer: Self-pay | Admitting: Family Medicine

## 2020-07-13 DIAGNOSIS — J45909 Unspecified asthma, uncomplicated: Secondary | ICD-10-CM

## 2020-07-13 DIAGNOSIS — I10 Essential (primary) hypertension: Secondary | ICD-10-CM

## 2020-07-26 DIAGNOSIS — H40011 Open angle with borderline findings, low risk, right eye: Secondary | ICD-10-CM | POA: Diagnosis not present

## 2020-08-14 ENCOUNTER — Encounter: Payer: Self-pay | Admitting: *Deleted

## 2020-10-11 ENCOUNTER — Other Ambulatory Visit: Payer: Self-pay | Admitting: Family Medicine

## 2020-10-11 DIAGNOSIS — J45909 Unspecified asthma, uncomplicated: Secondary | ICD-10-CM

## 2020-10-11 DIAGNOSIS — I10 Essential (primary) hypertension: Secondary | ICD-10-CM

## 2020-10-23 ENCOUNTER — Ambulatory Visit: Payer: Medicare HMO | Admitting: Family Medicine

## 2020-11-05 ENCOUNTER — Ambulatory Visit (INDEPENDENT_AMBULATORY_CARE_PROVIDER_SITE_OTHER): Payer: Medicare HMO

## 2020-11-05 DIAGNOSIS — Z0000 Encounter for general adult medical examination without abnormal findings: Secondary | ICD-10-CM | POA: Diagnosis not present

## 2020-11-05 NOTE — Progress Notes (Signed)
MEDICARE ANNUAL WELLNESS VISIT  11/05/2020  Telephone Visit Disclaimer This Medicare AWV was conducted by telephone due to national recommendations for restrictions regarding the COVID-19 Pandemic (e.g. social distancing).  I verified, using two identifiers, that I am speaking with Kimberly Aguilar or their authorized healthcare agent. I discussed the limitations, risks, security, and privacy concerns of performing an evaluation and management service by telephone and the potential availability of an in-person appointment in the future. The patient expressed understanding and agreed to proceed.  Location of Patient: Home Location of Provider (nurse):  [facility]  Subjective:    Kimberly Aguilar is a 70 y.o. female patient of Janora Norlander, DO who had a Medicare Annual Wellness Visit today via telephone. Kimberly Aguilar is working part time and lives with her sisters most of the time.  She also has a house in [place].  She had one son and one daughter, but they are now both deceased.  She reports that she is socially active and does interact with friends/family regularly. She is moderately physically active and enjoys walking and sewing.  Patient Care Team: Janora Norlander, DO as PCP - General (Family Medicine) Claris Pong, MD as Referring Physician (Internal Medicine) Maple Hudson, MD (Ophthalmology)  Advanced Directives 11/05/2020 02/15/2018 07/02/2016 08/07/2013 07/31/2013  Does Patient Have a Medical Advance Directive? No No No Patient does not have advance directive Patient does not have advance directive;Patient would like information  Would patient like information on creating a medical advance directive? No - Patient declined Yes (MAU/Ambulatory/Procedural Areas - Information given) - - Advance directive packet given    Hospital Utilization Over the Past 12 Months: # of hospitalizations or ER visits: 0 # of surgeries: 0  Review of Systems    Patient reports that her overall  health is unchanged compared to last year.  History obtained from chart review and the patient  Patient Reported Readings (BP, Pulse, CBG, Weight, etc) none  Pain Assessment Pain : No/denies pain     Current Medications & Allergies (verified) Allergies as of 11/05/2020   No Known Allergies      Medication List        Accurate as of November 05, 2020  9:17 AM. If you have any questions, ask your nurse or doctor.          amLODipine 2.5 MG tablet Commonly known as: NORVASC TAKE ONE TABLET EVERY MORNING   atorvastatin 20 MG tablet Commonly known as: LIPITOR Take 0.5 tablets (10 mg total) by mouth at bedtime.   BL FLAX SEED OIL PO Take 1 tablet by mouth daily.   Breo Ellipta 100-25 MCG/INH Aepb Generic drug: fluticasone furoate-vilanterol USE 1 INHALATION DAILY   tiZANidine 4 MG tablet Commonly known as: Zanaflex Take 0.5-1 tablets (2-4 mg total) by mouth every 8 (eight) hours as needed for muscle spasms.        History (reviewed): Past Medical History:  Diagnosis Date   Adenoma    PARATHYROID   Asthma    Colon polyp    Hypercalcemia    Hyperparathyroidism ([place]) 06/30/2013   Hypertension    Parathyroid adenoma 2015   Past Surgical History:  Procedure Laterality Date   COLONOSCOPY  2012   cyst removal from left breast     PARATHYROIDECTOMY Right 08/07/2013   Procedure: PARATHYROIDECTOMY;  Surgeon: [name], MD;  Location: [facility];  Service: General;  Laterality: Right;   Family History  Problem Relation Age of Onset   Hypertension  Mother    Heart disease Mother    Hypertension Father    Congestive Heart Failure Brother    Heart disease Brother    Heart disease Brother    Diabetes Brother    Diabetes Sister    Seizures Brother    Cancer Sister 29       Uterine Cancer had hysterectomy   Hypertension Brother    Hyperlipidemia Brother    Seizures Son    Colon cancer Neg Hx    Social History   Socioeconomic History   Marital status:  Widowed    Spouse name: Not on file   Number of children: 2   Years of education: Not on file   Highest education level: Not on file  Occupational History   Not on file  Tobacco Use   Smoking status: Former    Types: Cigarettes    Quit date: 03/31/1973    Years since quitting: 47.6   Smokeless tobacco: Never  Vaping Use   Vaping Use: Never used  Substance and Sexual Activity   Alcohol use: No   Drug use: No   Sexual activity: Not on file  Other Topics Concern   Not on file  Social History Narrative   Ms Fors has a daughter who resides in [place]   She had a son, who was disabled after he sustained TBI 2/2 meningitis, but he passed away 06-09-2017.   Social Determinants of Health   Financial Resource Strain: Not on file  Food Insecurity: Not on file  Transportation Needs: Not on file  Physical Activity: Not on file  Stress: Not on file  Social Connections: Not on file    Activities of Daily Living In your present state of health, do you have any difficulty performing the following activities: 11/05/2020 11/05/2020  Hearing? N N  Vision? N N  Difficulty concentrating or making decisions? N N  Walking or climbing stairs? N N  Dressing or bathing? N N  Doing errands, shopping? N N  Preparing Food and eating ? N N  Using the Toilet? N N  In the past six months, have you accidently leaked urine? N N  Do you have problems with loss of bowel control? N N  Managing your Medications? N N  Managing your Finances? N N  Housekeeping or managing your Housekeeping? N N  Some recent data might be hidden    Patient Education/ Literacy How often do you need to have someone help you when you read instructions, pamphlets, or other written materials from your doctor or pharmacy?: 1 - Never What is the last grade level you completed in school?: 12th grade  Exercise Current Exercise Habits: Home exercise routine, Type of exercise: walking, Time (Minutes): 40, Frequency  (Times/Week): 3, Weekly Exercise (Minutes/Week): 120, Intensity: Mild, Exercise limited by: None identified  Diet Patient reports consuming 2 meals a day and 2 snack(s) a day Patient reports that her primary diet is: Regular Patient reports that she does have regular access to food.   Depression Screen PHQ 2/9 Scores 11/05/2020 04/25/2020 09/13/2019 02/21/2019 11/07/2018 02/15/2018 01/31/2018  PHQ - 2 Score 0 0 0 1 0 0 0  PHQ- 9 Score - 0 - 1 0 - -     Fall Risk Fall Risk  11/05/2020 04/25/2020 09/13/2019 02/21/2019 02/15/2018  Falls in the past year? 0 0 0 0 No  Number falls in past yr: - 0 - - -  Follow up Falls evaluation completed - - - -  Objective:  Kimberly Aguilar seemed alert and oriented and she participated appropriately during our telephone visit.  Blood Pressure Weight BMI  BP Readings from Last 3 Encounters:  04/25/20 133/74  09/13/19 128/71  02/21/19 138/77   Wt Readings from Last 3 Encounters:  04/25/20 128 lb (58.1 kg)  09/13/19 137 lb 9.6 oz (62.4 kg)  02/21/19 135 lb (61.2 kg)   BMI Readings from Last 1 Encounters:  04/25/20 23.41 kg/m    *Unable to obtain current vital signs, weight, and BMI due to telephone visit type  Hearing/Vision  Kimberly Aguilar did not seem to have difficulty with hearing/understanding during the telephone conversation Reports that she has had a formal eye exam by an eye care professional within the past year Reports that she has not had a formal hearing evaluation within the past year *Unable to fully assess hearing and vision during telephone visit type  Cognitive Function: 6CIT Screen 11/05/2020  What Year? 0 points  What month? 0 points  What time? 0 points  Count back from 20 0 points  Months in reverse 0 points  Repeat phrase 2 points  Total Score 2   (Normal:0-7, Significant for Dysfunction: >8)  Normal Cognitive Function Screening: Yes   Immunization & Health Maintenance Record Immunization History  Administered Date(s)  Administered   Influenza,inj,Quad PF,6+ Mos 03/03/2020   Moderna Sars-Covid-2 Vaccination 06/12/2019, 07/11/2019, 03/21/2020   Td 09/25/2009    Health Maintenance  Topic Date Due   Hepatitis C Screening  Never done   Zoster Vaccines- Shingrix (1 of 2) Never done   PNA vac Low Risk Adult (1 of 2 - PCV13) Never done   COVID-19 Vaccine (4 - Booster for Moderna series) 07/20/2020   TETANUS/TDAP  01/24/2021   MAMMOGRAM  02/11/2021   COLONOSCOPY (Pts 45-29yrs Insurance coverage will need to be confirmed)  07/07/2021   DEXA SCAN  Completed   HPV VACCINES  Aged Out   INFLUENZA VACCINE  Discontinued       Assessment  This is a routine wellness examination for [employer].  Health Maintenance: Due or Overdue Health Maintenance Due  Topic Date Due   Hepatitis C Screening  Never done   Zoster Vaccines- Shingrix (1 of 2) Never done   PNA vac Low Risk Adult (1 of 2 - PCV13) Never done   COVID-19 Vaccine (4 - Booster for Moderna series) 07/20/2020    Kimberly Aguilar does not need a referral for Community Assistance: Care Management:   no Social Work:    no Prescription Assistance:  no Nutrition/Diabetes Education:  no   Plan:  Personalized Goals  Goals Addressed             This Visit's Progress    Patient Stated       11/05/2020 AWV Goal: Fall Prevention  Over the next year, patient will decrease their risk for falls by: Using assistive devices, such as a cane or walker, as needed Identifying fall risks within their home and correcting them by: Removing throw rugs Adding handrails to stairs or ramps Removing clutter and keeping a clear pathway throughout the home Increasing light, especially at night Adding shower handles/bars Raising toilet seat Identifying potential personal risk factors for falls: Medication side effects Incontinence/urgency Vestibular dysfunction Hearing loss Musculoskeletal disorders Neurological disorders Orthostatic hypotension          Personalized Health Maintenance & Screening Recommendations  Pneumococcal vaccine  Td vaccine Bone densitometry screening  Lung Cancer Screening Recommended: no (Low Dose CT Chest  recommended if Age 55-80 years, 30 pack-year currently smoking OR have quit w/in past 15 years) Hepatitis C Screening recommended: yes HIV Screening recommended: no  Advanced Directives: Written information was not prepared per patient's request.  Referrals & Orders No orders of the defined types were placed in this encounter.   Follow-up Plan Follow-up with Janora Norlander, DO as planned Schedule Dexa Scan    I have personally reviewed and noted the following in the patient's chart:   Medical and social history Use of alcohol, tobacco or illicit drugs  Current medications and supplements Functional ability and status Nutritional status Physical activity Advanced directives List of other physicians Hospitalizations, surgeries, and ER visits in previous 12 months Vitals Screenings to include cognitive, depression, and falls Referrals and appointments  In addition, I have reviewed and discussed with Kimberly Aguilar certain preventive protocols, quality metrics, and best practice recommendations. A written personalized care plan for preventive services as well as general preventive health recommendations is available and can be mailed to the patient at her request.      Felicity Coyer, LPN    [id number]

## 2020-11-06 ENCOUNTER — Ambulatory Visit (INDEPENDENT_AMBULATORY_CARE_PROVIDER_SITE_OTHER): Payer: Medicare HMO

## 2020-11-06 ENCOUNTER — Encounter: Payer: Self-pay | Admitting: Family Medicine

## 2020-11-06 ENCOUNTER — Ambulatory Visit (INDEPENDENT_AMBULATORY_CARE_PROVIDER_SITE_OTHER): Payer: Medicare HMO | Admitting: Family Medicine

## 2020-11-06 ENCOUNTER — Other Ambulatory Visit: Payer: Self-pay

## 2020-11-06 VITALS — BP 131/78 | HR 72 | Temp 97.9°F | Ht 62.0 in | Wt 130.4 lb

## 2020-11-06 DIAGNOSIS — R69 Illness, unspecified: Secondary | ICD-10-CM | POA: Diagnosis not present

## 2020-11-06 DIAGNOSIS — Z780 Asymptomatic menopausal state: Secondary | ICD-10-CM

## 2020-11-06 DIAGNOSIS — R7989 Other specified abnormal findings of blood chemistry: Secondary | ICD-10-CM

## 2020-11-06 DIAGNOSIS — L309 Dermatitis, unspecified: Secondary | ICD-10-CM | POA: Diagnosis not present

## 2020-11-06 DIAGNOSIS — M609 Myositis, unspecified: Secondary | ICD-10-CM

## 2020-11-06 DIAGNOSIS — F418 Other specified anxiety disorders: Secondary | ICD-10-CM

## 2020-11-06 DIAGNOSIS — M25562 Pain in left knee: Secondary | ICD-10-CM | POA: Diagnosis not present

## 2020-11-06 DIAGNOSIS — G8929 Other chronic pain: Secondary | ICD-10-CM

## 2020-11-06 DIAGNOSIS — E7800 Pure hypercholesterolemia, unspecified: Secondary | ICD-10-CM

## 2020-11-06 DIAGNOSIS — T466X5A Adverse effect of antihyperlipidemic and antiarteriosclerotic drugs, initial encounter: Secondary | ICD-10-CM | POA: Diagnosis not present

## 2020-11-06 NOTE — Patient Instructions (Addendum)
Mammogram due 01/2021. Colon due 06/2021.  Basic Skin Care Your skin plays an important role in keeping the entire body healthy.  Below are some tips on how to try and maximize skin health from the outside in.  Bathe in mildly warm water every 1 to 3 days, followed by light drying and an application of a thick moisturizer cream or ointment, preferably one that comes in a tub. Fragrance free moisturizing bars or body washes are preferred such as Purpose, Cetaphil, Dove sensitive skin, Aveeno, [employer] or Vanicream products. Use a fragrance free cream or ointment, not a lotion, such as plain petroleum jelly or Vaseline ointment, Aquaphor, Vanicream, Eucerin cream or a generic version, CeraVe Cream, Cetaphil Restoraderm, Aveeno Eczema Therapy and [employer], among others. Children with very dry skin often need to put on these creams two, three or four times a day.  As much as possible, use these creams enough to keep the skin from looking dry. Consider using fragrance free/dye free detergent, such as Arm and Hammer for sensitive skin, Tide Free or All Free.   If I am prescribing a medication to go on the skin, the medicine goes on first to the areas that need it, followed by a thick cream as above to the entire body.  Nancy Fetter is a major cause of damage to the skin. I recommend sun protection for all of my patients. I prefer physical barriers such as hats with wide brims that cover the ears, long sleeve clothing with SPF protection including rash guards for swimming. These can be found seasonally at outdoor clothing companies, [employer] and [employer] and online at [employer].com, www.uvskinz.com and PlayDetails.hu. Avoid peak sun between the hours of 10am to 3pm to minimize sun exposure.  I recommend sunscreen for all of my patients older than 19 months of age when in the sun, preferably with broad spectrum coverage and SPF 30 or higher.  For children, I recommend sunscreens that  only contain titanium dioxide and/or zinc oxide in the active ingredients. These do not burn the eyes and appear to be safer than chemical sunscreens. These sunscreens include zinc oxide paste found in the diaper section, Vanicream Broad Spectrum 50+, Aveeno Natural Mineral Protection, Neutrogena Pure and Free Baby, Johnson and [employer] Daily face and body lotion, Bed Bath & Beyond, among others. There is no such thing as waterproof sunscreen. All sunscreens should be reapplied after 60-80 minutes of wear.  Spray on sunscreens often use chemical sunscreens which do protect against the sun. However, these can be difficult to apply correctly, especially if wind is present, and can be more likely to irritate the skin.  Long term effects of chemical sunscreens are also not fully known.

## 2020-11-06 NOTE — Progress Notes (Signed)
Subjective: CC: HLD, HTN PCP: Janora Norlander, DO PXT:GGYI Preslar is a 70 y.o. female presenting to clinic today for:  1. HTN and HLD Patient is compliant with Norvasc 2.5 mg daily.  Has discontinued use of Lipitor, as it was giving her leg pain despite use of lower dose.  Continues to tolerate exercise without difficulty with exception of occasional left knee pain see below.  No chest pain, shortness of breath  2.  Chronic left knee pain Patient reports that she has had some intermittent but chronic left-sided knee pain.  She points to the anterior lateral and lower anterior aspect of the left knee.  No preceding injury that she knows of.  She walks regularly and sometimes it is exacerbated by those activities.  She has used some peppermint oil to the affected area and this seemed to help some.  3.  Skin lesions Occasionally she will get vesicular skin lesions in various parts of her legs and abdomen.  This seemed to occur more when she would mow her grass but she has not done anything like that recently.  They leave behind dark spots.  No active lesions currently but she wanted to inquire about this.  4.  Elevated serum creatinine Noted on last couple of checks but GFR has been above 60.  She worries about renal function given family history of renal disease in many people progressing to ESRD on HD.  She avoids NSAIDs, hydrates very well.  5.  Situational anxiety Patient reports occasionally she will become very shaky and anxious.  This seems to surround thoughts of her child, who died of COVID-49.  She seems to be dealing with this and with other stressors independently and or with the assistance of her church and prayer.    ROS: Per HPI  No Known Allergies Past Medical History:  Diagnosis Date   Adenoma    PARATHYROID   Asthma    Colon polyp    Hypercalcemia    Hyperparathyroidism ([facility]) 06/30/2013   Hypertension    Parathyroid adenoma 2015    Current Outpatient  Medications:    amLODipine (NORVASC) 2.5 MG tablet, TAKE ONE TABLET EVERY MORNING, Disp: 90 tablet, Rfl: 0   BREO ELLIPTA 100-25 MCG/INH AEPB, USE 1 INHALATION DAILY, Disp: 60 each, Rfl: 0   Flaxseed, Linseed, (BL FLAX SEED OIL PO), Take 1 tablet by mouth daily. , Disp: , Rfl:    atorvastatin (LIPITOR) 20 MG tablet, Take 0.5 tablets (10 mg total) by mouth at bedtime. (Patient not taking: No sig reported), Disp: 45 tablet, Rfl: 3   tiZANidine (ZANAFLEX) 4 MG tablet, Take 0.5-1 tablets (2-4 mg total) by mouth every 8 (eight) hours as needed for muscle spasms. (Patient not taking: No sig reported), Disp: 30 tablet, Rfl: 0 Social History   Socioeconomic History   Marital status: Widowed    Spouse name: Not on file   Number of children: 2   Years of education: Not on file   Highest education level: Not on file  Occupational History   Not on file  Tobacco Use   Smoking status: Former    Types: Cigarettes    Quit date: 03/31/1973    Years since quitting: 47.6   Smokeless tobacco: Never  Vaping Use   Vaping Use: Never used  Substance and Sexual Activity   Alcohol use: No   Drug use: No   Sexual activity: Not on file  Other Topics Concern   Not on file  Social History  Narrative   Ms Kimberly Aguilar has a daughter who resides in [place]   She had a son, who was disabled after he sustained TBI 2/2 meningitis, but he passed away Jun 10, 2017.   Social Determinants of Health   Financial Resource Strain: Not on file  Food Insecurity: Not on file  Transportation Needs: Not on file  Physical Activity: Not on file  Stress: Not on file  Social Connections: Not on file  Intimate Partner Violence: Not on file   Family History  Problem Relation Age of Onset   Hypertension Mother    Heart disease Mother    Hypertension Father    Congestive Heart Failure Brother    Heart disease Brother    Heart disease Brother    Diabetes Brother    Diabetes Sister    Seizures Brother    Cancer Sister 66        Uterine Cancer had hysterectomy   Hypertension Brother    Hyperlipidemia Brother    Seizures Son    Colon cancer Neg Hx     Objective: Office vital signs reviewed. BP 131/78   Pulse 72   Temp 97.9 F (36.6 C)   Ht 5\' 2"  (1.575 m)   Wt 130 lb 6.4 oz (59.1 kg)   SpO2 99%   BMI 23.85 kg/m   Physical Examination:  General: Awake, alert, well nourished, No acute distress HEENT: Normal, sclera white. Cardio: regular rate and rhythm, S1S2 heard, no murmurs appreciated Pulm: clear to auscultation bilaterally, no wheezes, rhonchi or rales; normal work of breathing on room air GI: soft, non-tender, non-distended, bowel sounds present x4, no hepatomegaly, no splenomegaly, no masses Extremities: warm, well perfused, No edema, cyanosis or clubbing; +2 pulses bilaterally MSK: Normal gait and station  Left knee: She has full painless active range of motion.  No tenderness to palpation to the patella, patellar tendon, quad tendon, medial or lateral joint line.  No tenderness to palpation to the posterior popliteal fossa nor any palpable masses.  No ligamentous laxity.  Very minimal joint effusion noted medially  Assessment/ Plan: 70 y.o. female   Pure hypercholesterolemia - Plan: Lipid Panel  Statin-induced myositis  Elevated serum creatinine - Plan: Renal Function Panel  Asymptomatic postmenopausal estrogen deficiency - Plan: DG [facility] DEXA  Chronic pain of left knee  Dermatitis  Situational anxiety  Has been off of the statin secondary to statin induced myopathy.  Check fasting lipid panel  Has had elevated serum creatinine with normal GFR.  Check renal function panel.  Discussed adequate hydration, avoidance of NSAIDs  No DEXA since 2017.  This was normal at that time.  Check bone density scan  Left knee pain likely meniscal in nature.  Gave home physical therapy exercises for rehab and okay to continue using peppermint oil if this is helpful for pain  I have given her handout  on home skin care.  The lesions that she describes sound vesicular in nature.  It certainly does not sound cluster like to suggest shingles and does not involve the interdigital spaces to suggest dyshidrotic eczema  Continues to have some situational anxiety that is infrequent and seems to be related to thoughts of her child who passed away from COVID-19.  Offered Atarax but she would like to hold off on this as she seems to be self coping and does not really like taking pills.  No orders of the defined types were placed in this encounter.  No orders of the defined types were  placed in this encounter.    Janora Norlander, DO [place] [phone]

## 2020-11-07 ENCOUNTER — Other Ambulatory Visit: Payer: Self-pay | Admitting: *Deleted

## 2020-11-07 DIAGNOSIS — E7800 Pure hypercholesterolemia, unspecified: Secondary | ICD-10-CM

## 2020-11-07 LAB — RENAL FUNCTION PANEL
Albumin: 4.5 g/dL (ref 3.8–4.8)
BUN/Creatinine Ratio: 11 — ABNORMAL LOW (ref 12–28)
BUN: 12 mg/dL (ref 8–27)
CO2: 26 mmol/L (ref 20–29)
Calcium: 9.8 mg/dL (ref 8.7–10.3)
Chloride: 101 mmol/L (ref 96–106)
Creatinine, Ser: 1.13 mg/dL — ABNORMAL HIGH (ref 0.57–1.00)
Glucose: 91 mg/dL (ref 65–99)
Phosphorus: 3.4 mg/dL (ref 3.0–4.3)
Potassium: 4.4 mmol/L (ref 3.5–5.2)
Sodium: 140 mmol/L (ref 134–144)
eGFR: 53 mL/min/{1.73_m2} — ABNORMAL LOW (ref >59)

## 2020-11-07 LAB — LIPID PANEL
Chol/HDL Ratio: 2.7 ratio (ref 0.0–4.4)
Cholesterol, Total: 252 mg/dL — ABNORMAL HIGH (ref 100–199)
HDL: 94 mg/dL (ref >39)
LDL Chol Calc (NIH): 150 mg/dL — ABNORMAL HIGH (ref 0–99)
Triglycerides: 52 mg/dL (ref 0–149)
VLDL Cholesterol Cal: 8 mg/dL (ref 5–40)

## 2020-11-07 MED ORDER — ROSUVASTATIN CALCIUM 10 MG PO TABS: 10.0000 mg | ORAL_TABLET | Freq: Every day | ORAL | 3 refills | Status: DC

## 2020-11-07 NOTE — Progress Notes (Signed)
Pt aware = labs placed and crestor ordered

## 2020-11-08 DIAGNOSIS — Z780 Asymptomatic menopausal state: Secondary | ICD-10-CM | POA: Diagnosis not present

## 2020-11-13 ENCOUNTER — Other Ambulatory Visit: Payer: Self-pay | Admitting: Family Medicine

## 2020-11-13 DIAGNOSIS — J45909 Unspecified asthma, uncomplicated: Secondary | ICD-10-CM

## 2020-11-21 DIAGNOSIS — Z23 Encounter for immunization: Secondary | ICD-10-CM | POA: Diagnosis not present

## 2021-01-10 ENCOUNTER — Other Ambulatory Visit: Payer: Self-pay | Admitting: Family Medicine

## 2021-01-10 DIAGNOSIS — I10 Essential (primary) hypertension: Secondary | ICD-10-CM

## 2021-01-14 ENCOUNTER — Other Ambulatory Visit (HOSPITAL_COMMUNITY): Payer: Self-pay | Admitting: Family Medicine

## 2021-01-14 DIAGNOSIS — Z1231 Encounter for screening mammogram for malignant neoplasm of breast: Secondary | ICD-10-CM

## 2021-02-13 ENCOUNTER — Other Ambulatory Visit: Payer: Self-pay

## 2021-02-13 ENCOUNTER — Ambulatory Visit (HOSPITAL_COMMUNITY)
Admission: RE | Admit: 2021-02-13 | Discharge: 2021-02-13 | Disposition: A | Discharge status: Home / Self Care | Payer: Medicare HMO | Source: Ambulatory Visit | Attending: Family Medicine | Admitting: Family Medicine

## 2021-02-13 DIAGNOSIS — Z1231 Encounter for screening mammogram for malignant neoplasm of breast: Secondary | ICD-10-CM | POA: Diagnosis not present

## 2021-02-21 ENCOUNTER — Other Ambulatory Visit: Payer: Self-pay | Admitting: Family Medicine

## 2021-02-21 ENCOUNTER — Other Ambulatory Visit: Payer: Medicare HMO

## 2021-02-21 ENCOUNTER — Other Ambulatory Visit: Payer: Self-pay

## 2021-02-21 DIAGNOSIS — T466X5A Adverse effect of antihyperlipidemic and antiarteriosclerotic drugs, initial encounter: Secondary | ICD-10-CM | POA: Diagnosis not present

## 2021-02-21 DIAGNOSIS — M609 Myositis, unspecified: Secondary | ICD-10-CM | POA: Diagnosis not present

## 2021-02-21 DIAGNOSIS — E7800 Pure hypercholesterolemia, unspecified: Secondary | ICD-10-CM

## 2021-02-22 LAB — HEPATIC FUNCTION PANEL
ALT: 10 IU/L (ref 0–32)
AST: 15 IU/L (ref 0–40)
Albumin: 4.4 g/dL (ref 3.8–4.8)
Alkaline Phosphatase: 70 IU/L (ref 44–121)
Bilirubin Total: 0.2 mg/dL (ref 0.0–1.2)
Bilirubin, Direct: <0.1 mg/dL (ref 0.00–0.40)
Total Protein: 6.5 g/dL (ref 6.0–8.5)

## 2021-02-22 LAB — LIPID PANEL
Chol/HDL Ratio: 2.4 ratio (ref 0.0–4.4)
Cholesterol, Total: 215 mg/dL — ABNORMAL HIGH (ref 100–199)
HDL: 88 mg/dL (ref >39)
LDL Chol Calc (NIH): 118 mg/dL — ABNORMAL HIGH (ref 0–99)
Triglycerides: 50 mg/dL (ref 0–149)
VLDL Cholesterol Cal: 9 mg/dL (ref 5–40)

## 2021-02-24 DIAGNOSIS — H43393 Other vitreous opacities, bilateral: Secondary | ICD-10-CM | POA: Diagnosis not present

## 2021-04-07 ENCOUNTER — Other Ambulatory Visit: Payer: Self-pay | Admitting: Family Medicine

## 2021-04-07 DIAGNOSIS — I10 Essential (primary) hypertension: Secondary | ICD-10-CM

## 2021-05-06 ENCOUNTER — Ambulatory Visit (INDEPENDENT_AMBULATORY_CARE_PROVIDER_SITE_OTHER): Payer: Medicare HMO | Admitting: Family Medicine

## 2021-05-06 ENCOUNTER — Encounter: Payer: Self-pay | Admitting: Family Medicine

## 2021-05-06 VITALS — BP 132/75 | HR 69 | Temp 97.3°F | Ht 62.0 in | Wt 132.8 lb

## 2021-05-06 DIAGNOSIS — I10 Essential (primary) hypertension: Secondary | ICD-10-CM | POA: Diagnosis not present

## 2021-05-06 DIAGNOSIS — E7800 Pure hypercholesterolemia, unspecified: Secondary | ICD-10-CM | POA: Diagnosis not present

## 2021-05-06 LAB — CMP14+EGFR
ALT: 9 IU/L (ref 0–32)
AST: 14 IU/L (ref 0–40)
Albumin/Globulin Ratio: 1.9 (ref 1.2–2.2)
Albumin: 4.7 g/dL (ref 3.8–4.8)
Alkaline Phosphatase: 65 IU/L (ref 44–121)
BUN/Creatinine Ratio: 14 (ref 12–28)
BUN: 15 mg/dL (ref 8–27)
Bilirubin Total: 0.4 mg/dL (ref 0.0–1.2)
CO2: 26 mmol/L (ref 20–29)
Calcium: 9.9 mg/dL (ref 8.7–10.3)
Chloride: 104 mmol/L (ref 96–106)
Creatinine, Ser: 1.1 mg/dL — ABNORMAL HIGH (ref 0.57–1.00)
Globulin, Total: 2.5 g/dL (ref 1.5–4.5)
Glucose: 94 mg/dL (ref 70–99)
Potassium: 4.5 mmol/L (ref 3.5–5.2)
Sodium: 144 mmol/L (ref 134–144)
Total Protein: 7.2 g/dL (ref 6.0–8.5)
eGFR: 54 mL/min/{1.73_m2} — ABNORMAL LOW (ref >59)

## 2021-05-06 LAB — LIPID PANEL
Chol/HDL Ratio: 2.7 ratio (ref 0.0–4.4)
Cholesterol, Total: 259 mg/dL — ABNORMAL HIGH (ref 100–199)
HDL: 95 mg/dL (ref >39)
LDL Chol Calc (NIH): 155 mg/dL — ABNORMAL HIGH (ref 0–99)
Triglycerides: 58 mg/dL (ref 0–149)
VLDL Cholesterol Cal: 9 mg/dL (ref 5–40)

## 2021-05-06 NOTE — Progress Notes (Signed)
Subjective: CC: Hypertension hyperlipidemia follow-up PCP: Janora Norlander, DO LDJ:TTSV Villers is a 71 y.o. female presenting to clinic today for:  1.  Hypertension with hyperlipidemia Patient is compliant with Norvasc 2.5 mg daily, Crestor 10 mg daily.  She is here for repeat fasting lipid.  No chest pain, shortness of breath, myalgias reported  She does report occasionally feeling jittery but this seems to be after she inhales her Breo  ROS: Per HPI  No Known Allergies Past Medical History:  Diagnosis Date   Adenoma    PARATHYROID   Asthma    Colon polyp    Hypercalcemia    Hyperparathyroidism ([place]) 06/30/2013   Hypertension    Parathyroid adenoma 06/22/13    Current Outpatient Medications:    amLODipine (NORVASC) 2.5 MG tablet, TAKE ONE TABLET EVERY MORNING, Disp: 90 tablet, Rfl: 0   BREO ELLIPTA 100-25 MCG/INH AEPB, USE 1 INHALATION DAILY, Disp: 60 each, Rfl: 12   Flaxseed, Linseed, (BL FLAX SEED OIL PO), Take 1 tablet by mouth daily. , Disp: , Rfl:    rosuvastatin (CRESTOR) 10 MG tablet, Take 1 tablet (10 mg total) by mouth daily., Disp: 90 tablet, Rfl: 3   tiZANidine (ZANAFLEX) 4 MG tablet, Take 0.5-1 tablets (2-4 mg total) by mouth every 8 (eight) hours as needed for muscle spasms. (Patient not taking: Reported on 11/05/2020), Disp: 30 tablet, Rfl: 0 Social History   Socioeconomic History   Marital status: Widowed    Spouse name: Not on file   Number of children: 2   Years of education: Not on file   Highest education level: Not on file  Occupational History   Not on file  Tobacco Use   Smoking status: Former    Types: Cigarettes    Quit date: 03/31/1973    Years since quitting: 48.1   Smokeless tobacco: Never  Vaping Use   Vaping Use: Never used  Substance and Sexual Activity   Alcohol use: No   Drug use: No   Sexual activity: Not on file  Other Topics Concern   Not on file  Social History Narrative   Ms Godeaux has a daughter who resides in [place]    She had a son, who was disabled after he sustained TBI 2/2 meningitis, but he passed away Jun 22, 2017.   Social Determinants of Health   Financial Resource Strain: Not on file  Food Insecurity: Not on file  Transportation Needs: Not on file  Physical Activity: Not on file  Stress: Not on file  Social Connections: Not on file  Intimate Partner Violence: Not on file   Family History  Problem Relation Age of Onset   Hypertension Mother    Heart disease Mother    Hypertension Father    Congestive Heart Failure Brother    Heart disease Brother    Heart disease Brother    Diabetes Brother    Diabetes Sister    Seizures Brother    Cancer Sister 65       Uterine Cancer had hysterectomy   Hypertension Brother    Hyperlipidemia Brother    Seizures Son    Colon cancer Neg Hx     Objective: Office vital signs reviewed. BP 132/75    Pulse 69    Temp (!) 97.3 F (36.3 C)    Ht _0  (1.575 m)    Wt 132 lb 12.8 oz (60.2 kg)    SpO2 99%    BMI 24.29 kg/m   Physical Examination:  General: Awake, alert, well nourished, No acute distress Cardio: regular rate and rhythm, S1S2 heard, no murmurs appreciated Pulm: clear to auscultation bilaterally, no wheezes, rhonchi or rales; normal work of breathing on room air  Assessment/ Plan: 71 y.o. female   Pure hypercholesterolemia - Plan: Lipid panel, CMP14+EGFR  Primary hypertension - Plan: CMP14+EGFR  Check fasting lipid panel, CMP.  May need to consider advancing her Crestor dose pending these results  Blood pressure under excellent control.  No changes.  No refills needed  No orders of the defined types were placed in this encounter.  No orders of the defined types were placed in this encounter.    Janora Norlander, DO [place] [phone]

## 2021-07-04 ENCOUNTER — Other Ambulatory Visit: Payer: Self-pay | Admitting: Family Medicine

## 2021-07-04 DIAGNOSIS — I10 Essential (primary) hypertension: Secondary | ICD-10-CM

## 2021-07-22 DIAGNOSIS — H40013 Open angle with borderline findings, low risk, bilateral: Secondary | ICD-10-CM | POA: Diagnosis not present

## 2021-07-30 ENCOUNTER — Encounter: Payer: Self-pay | Admitting: Gastroenterology

## 2021-09-12 ENCOUNTER — Encounter: Payer: Self-pay | Admitting: Gastroenterology

## 2021-10-02 ENCOUNTER — Other Ambulatory Visit: Payer: Self-pay | Admitting: Family Medicine

## 2021-10-02 DIAGNOSIS — I10 Essential (primary) hypertension: Secondary | ICD-10-CM

## 2021-10-23 ENCOUNTER — Ambulatory Visit (AMBULATORY_SURGERY_CENTER): Payer: Self-pay | Admitting: *Deleted

## 2021-10-23 VITALS — Ht 62.0 in | Wt 129.0 lb

## 2021-10-23 DIAGNOSIS — Z86010 Personal history of colonic polyps: Secondary | ICD-10-CM

## 2021-10-23 MED ORDER — NA SULFATE-K SULFATE-MG SULF 17.5-3.13-1.6 GM/177ML PO SOLN: 2.0000 | Freq: Once | ORAL | 0 refills | Status: AC

## 2021-10-23 NOTE — Progress Notes (Signed)

## 2021-11-07 ENCOUNTER — Encounter: Payer: Self-pay | Admitting: Gastroenterology

## 2021-11-07 ENCOUNTER — Telehealth: Payer: Self-pay | Admitting: Family Medicine

## 2021-11-07 NOTE — Telephone Encounter (Signed)
Left message for patient to call back and schedule Medicare Annual Wellness Visit (AWV) to be completed by video or phone.   Last AWV: 11/05/2020  Please schedule at anytime with [facility]     45 minute appointment  Any questions, please contact me at [phone]

## 2021-11-13 ENCOUNTER — Encounter: Payer: Self-pay | Admitting: Gastroenterology

## 2021-11-13 ENCOUNTER — Ambulatory Visit (AMBULATORY_SURGERY_CENTER): Payer: Medicare HMO | Admitting: Gastroenterology

## 2021-11-13 VITALS — BP 127/64 | HR 60 | Temp 96.2°F | Resp 13 | Ht 62.0 in | Wt 129.0 lb

## 2021-11-13 DIAGNOSIS — Z09 Encounter for follow-up examination after completed treatment for conditions other than malignant neoplasm: Secondary | ICD-10-CM | POA: Diagnosis not present

## 2021-11-13 DIAGNOSIS — D123 Benign neoplasm of transverse colon: Secondary | ICD-10-CM

## 2021-11-13 DIAGNOSIS — Z86010 Personal history of colonic polyps: Secondary | ICD-10-CM

## 2021-11-13 MED ORDER — SODIUM CHLORIDE 0.9 % IV SOLN: 500.0000 mL | Freq: Once | INTRAVENOUS | Status: DC

## 2021-11-13 NOTE — Progress Notes (Signed)
VS completed by CW.   Pt's states no medical or surgical changes since previsit or office visit.  

## 2021-11-13 NOTE — Patient Instructions (Signed)
YOU HAD AN ENDOSCOPIC PROCEDURE TODAY AT [facility]:   Refer to the procedure report that was given to you for any specific questions about what was found during the examination.  If the procedure report does not answer your questions, please call your gastroenterologist to clarify.  If you requested that your care partner not be given the details of your procedure findings, then the procedure report has been included in a sealed envelope for you to review at your convenience later.  YOU SHOULD EXPECT: Some feelings of bloating in the abdomen. Passage of more gas than usual.  Walking can help get rid of the air that was put into your GI tract during the procedure and reduce the bloating. If you had a lower endoscopy (such as a colonoscopy or flexible sigmoidoscopy) you may notice spotting of blood in your stool or on the toilet paper. If you underwent a bowel prep for your procedure, you may not have a normal bowel movement for a few days.  Please Note:  You might notice some irritation and congestion in your nose or some drainage.  This is from the oxygen used during your procedure.  There is no need for concern and it should clear up in a day or so.  SYMPTOMS TO REPORT IMMEDIATELY:  Following lower endoscopy (colonoscopy or flexible sigmoidoscopy):  Excessive amounts of blood in the stool  Significant tenderness or worsening of abdominal pains  Swelling of the abdomen that is new, acute  Fever of 100F or higher   For urgent or emergent issues, a gastroenterologist can be reached at any hour by calling [phone]. Do not use MyChart messaging for urgent concerns.    DIET:  We do recommend a small meal at first, but then you may proceed to your regular diet.  Drink plenty of fluids but you should avoid alcoholic beverages for 24 hours.  MEDICATIONS: Continue present medications.  Please see handouts given to you by your recovery nurse.  Thank you for allowing [place] to  provide for your healthcare needs today.  ACTIVITY:  You should plan to take it easy for the rest of today and you should NOT DRIVE or use heavy machinery until tomorrow (because of the sedation medicines used during the test).    FOLLOW UP: Our staff will call the number listed on your records the next business day following your procedure.  We will call around 7:15- 8:00 am to check on you and address any questions or concerns that you may have regarding the information given to you following your procedure. If we do not reach you, we will leave a message.  If you develop any symptoms (ie: fever, flu-like symptoms, shortness of breath, cough etc.) before then, please call [phone].  If you test positive for Covid 19 in the 2 weeks post procedure, please call and report this information to [place].    If any biopsies were taken you will be contacted by phone or by letter within the next 1-3 weeks.  Please call us at [phone] if you have not heard about the biopsies in 3 weeks.    SIGNATURES/CONFIDENTIALITY: You and/or your care partner have signed paperwork which will be entered into your electronic medical record.  These signatures attest to the fact that that the information above on your After Visit Summary has been reviewed and is understood.  Full responsibility of the confidentiality of this discharge information lies with you and/or your care-partner.

## 2021-11-13 NOTE — Op Note (Signed)
[facility] Patient Name: Kimberly Aguilar Procedure Date: 11/13/2021 10:29 AM MRN: [id number] Endoscopist: Mauri Pole , MD Age: 71 Referring MD:  Date of Birth: 05/26/1950 Gender: Female Account #: [id number] Procedure:                Colonoscopy Indications:              High risk colon cancer surveillance: Personal                            history of colonic polyps, High risk colon cancer                            surveillance: Personal history of adenoma (10 mm or                            greater in size) Medicines:                Monitored Anesthesia Care Procedure:                Pre-Anesthesia Assessment:                           - Prior to the procedure, a History and Physical                            was performed, and patient medications and                            allergies were reviewed. The patient's tolerance of                            previous anesthesia was also reviewed. The risks                            and benefits of the procedure and the sedation                            options and risks were discussed with the patient.                            All questions were answered, and informed consent                            was obtained. Prior Anticoagulants: The patient has                            taken no previous anticoagulant or antiplatelet                            agents. ASA Grade Assessment: II - A patient with                            mild systemic disease. After reviewing the risks  and benefits, the patient was deemed in                            satisfactory condition to undergo the procedure.                           After obtaining informed consent, the colonoscope                            was passed under direct vision. Throughout the                            procedure, the patient's blood pressure, pulse, and                            oxygen saturations were monitored  continuously. The                            Olympus PCF-H190DL (#[id number]) Colonoscope was                            introduced through the anus and advanced to the the                            cecum, identified by appendiceal orifice and                            ileocecal valve. The colonoscopy was performed                            without difficulty. The patient tolerated the                            procedure well. The quality of the bowel                            preparation was excellent. The ileocecal valve,                            appendiceal orifice, and rectum were photographed. Scope In: 10:34:18 AM Scope Out: 10:47:11 AM Scope Withdrawal Time: 0 hours 5 minutes 48 seconds  Total Procedure Duration: 0 hours 12 minutes 53 seconds  Findings:                 The perianal and digital rectal examinations were                            normal.                           A 5 mm polyp was found in the transverse colon. The                            polyp was sessile. The polyp was removed with a  cold snare. Resection and retrieval were complete.                           Non-bleeding external and internal hemorrhoids were                            found during retroflexion. The hemorrhoids were                            small. Complications:            No immediate complications. Estimated Blood Loss:     Estimated blood loss was minimal. Impression:               - One 5 mm polyp in the transverse colon, removed                            with a cold snare. Resected and retrieved.                           - Non-bleeding external and internal hemorrhoids. Recommendation:           - Patient has a contact number available for                            emergencies. The signs and symptoms of potential                            delayed complications were discussed with the                            patient. Return to normal activities  tomorrow.                            Written discharge instructions were provided to the                            patient.                           - Resume previous diet.                           - Continue present medications.                           - Await pathology results.                           - Repeat colonoscopy in 7 years for surveillance                            based on pathology results. Mauri Pole, MD 11/13/2021 10:54:17 AM This report has been signed electronically.

## 2021-11-13 NOTE — Progress Notes (Signed)
[facility] History and Physical   Primary Care Physician:  Janora Norlander, DO   Reason for Procedure:  History of adenomatous colon polyps  Plan:    Surveillance colonoscopy with possible interventions as needed     HPI: Kimberly Aguilar is a very pleasant 71 y.o. female here for surveillance colonoscopy. Denies any nausea, vomiting, abdominal pain, melena or bright red blood per rectum  The risks and benefits as well as alternatives of endoscopic procedure(s) have been discussed and reviewed. All questions answered. The patient agrees to proceed.    Past Medical History:  Diagnosis Date   Adenoma    PARATHYROID   Asthma    Colon polyp    Hypercalcemia    Hyperparathyroidism ([place]) 06/30/2013   Hypertension    Parathyroid adenoma 2015    Past Surgical History:  Procedure Laterality Date   COLONOSCOPY  2012   cyst removal from left breast     PARATHYROIDECTOMY Right 08/07/2013   Procedure: PARATHYROIDECTOMY;  Surgeon: [name], MD;  Location: [facility];  Service: General;  Laterality: Right;    Prior to Admission medications   Medication Sig Start Date End Date Taking? Authorizing Provider  amLODipine (NORVASC) 2.5 MG tablet Take 1 tablet (2.5 mg total) by mouth every morning. (NEEDS TO BE SEEN BEFORE NEXT REFILL) 10/02/21  Yes Gottschalk, Ashly M, DO  BREO ELLIPTA 100-25 MCG/INH AEPB USE 1 INHALATION DAILY 11/13/20  Yes Gottschalk, Ashly M, DO  Flaxseed, Linseed, (BL FLAX SEED OIL PO) Take 1 tablet by mouth daily.  Patient not taking: Reported on 10/23/2021    [provider]  rosuvastatin (CRESTOR) 10 MG tablet Take 1 tablet (10 mg total) by mouth daily. Patient not taking: Reported on 10/23/2021 11/07/20   Janora Norlander, DO  tiZANidine (ZANAFLEX) 4 MG tablet Take 0.5-1 tablets (2-4 mg total) by mouth every 8 (eight) hours as needed for muscle spasms. Patient not taking: Reported on 11/05/2020 04/25/20   Janora Norlander, DO    Current  Outpatient Medications  Medication Sig Dispense Refill   amLODipine (NORVASC) 2.5 MG tablet Take 1 tablet (2.5 mg total) by mouth every morning. (NEEDS TO BE SEEN BEFORE NEXT REFILL) 30 tablet 0   BREO ELLIPTA 100-25 MCG/INH AEPB USE 1 INHALATION DAILY 60 each 12   Flaxseed, Linseed, (BL FLAX SEED OIL PO) Take 1 tablet by mouth daily.  (Patient not taking: Reported on 10/23/2021)     rosuvastatin (CRESTOR) 10 MG tablet Take 1 tablet (10 mg total) by mouth daily. (Patient not taking: Reported on 10/23/2021) 90 tablet 3   tiZANidine (ZANAFLEX) 4 MG tablet Take 0.5-1 tablets (2-4 mg total) by mouth every 8 (eight) hours as needed for muscle spasms. (Patient not taking: Reported on 11/05/2020) 30 tablet 0   Current Facility-Administered Medications  Medication Dose Route Frequency Provider Last Rate Last Admin   0.9 %  sodium chloride infusion  500 mL Intravenous Once Mauri Pole, MD        Allergies as of 11/13/2021   (No Known Allergies)    Family History  Problem Relation Age of Onset   Hypertension Mother    Heart disease Mother    Hypertension Father    Diabetes Sister    Cancer Sister 44       Uterine Cancer had hysterectomy   Congestive Heart Failure Brother    Heart disease Brother    Heart disease Brother    Diabetes Brother    Seizures Brother  Hypertension Brother    Hyperlipidemia Brother    Seizures Son    Colon cancer Neg Hx    Colon polyps Neg Hx    Esophageal cancer Neg Hx    Stomach cancer Neg Hx    Rectal cancer Neg Hx     Social History   Socioeconomic History   Marital status: Widowed    Spouse name: Not on file   Number of children: 2   Years of education: Not on file   Highest education level: Not on file  Occupational History   Not on file  Tobacco Use   Smoking status: Former    Types: Cigarettes    Quit date: 03/31/1973    Years since quitting: 48.6   Smokeless tobacco: Never  Vaping Use   Vaping Use: Never used  Substance and Sexual  Activity   Alcohol use: No   Drug use: No   Sexual activity: Not on file  Other Topics Concern   Not on file  Social History Narrative   Ms Toenjes has a daughter who resides in [place]   She had a son, who was disabled after he sustained TBI 2/2 meningitis, but he passed away 2017/06/25.   Social Determinants of Health   Financial Resource Strain: Not on file  Food Insecurity: Not on file  Transportation Needs: Not on file  Physical Activity: Not on file  Stress: Not on file  Social Connections: Not on file  Intimate Partner Violence: Not on file    Review of Systems:  All other review of systems negative except as mentioned in the HPI.  Physical Exam: Vital signs in last 24 hours: BP (!) 174/78   Pulse 65   Temp (!) 96.2 F (35.7 C) (Temporal)   Ht '5\' 2"'$  (1.575 m)   Wt 129 lb (58.5 kg)   SpO2 96%   BMI 23.59 kg/m  General:   Alert, NAD Lungs:  Clear .   Heart:  Regular rate and rhythm Abdomen:  Soft, nontender and nondistended. Neuro/Psych:  Alert and cooperative. Normal mood and affect. A and O x 3  Reviewed labs, radiology imaging, old records and pertinent past GI work up  Patient is appropriate for planned procedure(s) and anesthesia in an ambulatory setting   K. Denzil Magnuson , MD [phone]

## 2021-11-13 NOTE — Progress Notes (Signed)
Called to room to assist during endoscopic procedure.  Patient ID and intended procedure confirmed with present staff. Received instructions for my participation in the procedure from the performing physician.  

## 2021-11-13 NOTE — Progress Notes (Signed)
VSS, transported to PACU °

## 2021-11-14 ENCOUNTER — Telehealth: Payer: Self-pay

## 2021-11-14 NOTE — Telephone Encounter (Signed)
  Follow up Call-     11/13/2021    9:32 AM  Call back number  Post procedure Call Back phone  # [phone]  Permission to leave phone message Yes     Patient questions:  Do you have a fever, pain , or abdominal swelling? No. Pain Score  0 *  Have you tolerated food without any problems? Yes.    Have you been able to return to your normal activities? Yes.    Do you have any questions about your discharge instructions: Diet   No. Medications  No. Follow up visit  No.  Do you have questions or concerns about your Care? No.  Actions: * If pain score is 4 or above: No action needed, pain <4.

## 2021-11-28 ENCOUNTER — Encounter: Payer: Self-pay | Admitting: Family Medicine

## 2021-11-28 ENCOUNTER — Telehealth: Payer: Self-pay | Admitting: Family Medicine

## 2021-11-28 ENCOUNTER — Ambulatory Visit (INDEPENDENT_AMBULATORY_CARE_PROVIDER_SITE_OTHER): Payer: Medicare HMO | Admitting: Family Medicine

## 2021-11-28 VITALS — BP 138/63 | HR 71 | Temp 97.8°F | Ht 62.0 in | Wt 137.2 lb

## 2021-11-28 DIAGNOSIS — N1831 Chronic kidney disease, stage 3a: Secondary | ICD-10-CM

## 2021-11-28 DIAGNOSIS — I129 Hypertensive chronic kidney disease with stage 1 through stage 4 chronic kidney disease, or unspecified chronic kidney disease: Secondary | ICD-10-CM

## 2021-11-28 DIAGNOSIS — M7632 Iliotibial band syndrome, left leg: Secondary | ICD-10-CM

## 2021-11-28 DIAGNOSIS — J4520 Mild intermittent asthma, uncomplicated: Secondary | ICD-10-CM

## 2021-11-28 DIAGNOSIS — E7800 Pure hypercholesterolemia, unspecified: Secondary | ICD-10-CM

## 2021-11-28 MED ORDER — FLUTICASONE FUROATE-VILANTEROL 100-25 MCG/ACT IN AEPB: 1.0000 | INHALATION_SPRAY | Freq: Every day | RESPIRATORY_TRACT | 11 refills | Status: DC

## 2021-11-28 MED ORDER — AMLODIPINE BESYLATE 2.5 MG PO TABS
2.5000 mg | ORAL_TABLET | Freq: Every morning | ORAL | 3 refills | Status: DC
Start: 2021-11-28 — End: 2022-12-04

## 2021-11-28 NOTE — Telephone Encounter (Signed)
done

## 2021-11-28 NOTE — Patient Instructions (Signed)
Tylenol arthritis is ok NO ibuprofen/ aleve/ motrin/ advil/ naproxen.  Your kidney function was slightly reduced on your last lab draw so do not want to worsen kidneys.  Chronic Kidney Disease, Adult Chronic kidney disease is when lasting damage happens to the kidneys slowly over a long time. The kidneys help to: Make pee (urine). Make hormones. Keep the right amount of fluids and chemicals in the body. Most often, this disease does not go away. You must take steps to help keep the kidney damage from getting worse. If steps are not taken, the kidneys might stop working forever. What are the causes? Diabetes. High blood pressure. Diseases that affect the heart and blood vessels. Other kidney diseases. Diseases of the body's disease-fighting system. A problem with the flow of pee. Infections of the organs that make pee, store it, and take it out of the body. Swelling or irritation of your blood vessels. What increases the risk? Getting older. Having someone in your family who has kidney disease or kidney failure. Having a disease caused by genes. Taking medicines often that harm the kidneys. Being near or having contact with harmful substances. Being very overweight. Using tobacco now or in the past. What are the signs or symptoms? Feeling very tired. Having a swollen face, legs, ankles, or feet. Feeling like you may vomit or vomiting. Not feeling hungry. Being confused or not able to focus. Twitches and cramps in the leg muscles or other muscles. Dry, itchy skin. A taste of metal in your mouth. Making less pee, or making more pee. Shortness of breath. Trouble sleeping. You may also become anemic or get weak bones. Anemic means there is not enough red blood cells or hemoglobin in your blood. You may get symptoms slowly. You may not notice them until the kidney damage gets very bad. How is this treated? Often, there is no cure for this disease. Treatment can help with symptoms  and help keep the disease from getting worse. You may need to: Avoid alcohol. Avoid foods that are high in salt, potassium, phosphorous, and protein. Take medicines for symptoms and to help control other conditions. Have dialysis. This treatment gets harmful waste out of your body. Treat other problems that cause your kidney disease or make it worse. Follow these instructions at home: Medicines Take over-the-counter and prescription medicines only as told by your doctor. Do not take any new medicines, vitamins, or supplements unless your doctor says it is okay. Lifestyle  Do not smoke or use any products that contain nicotine or tobacco. If you need help quitting, ask your doctor. If you drink alcohol: Limit how much you use to: 0-1 drink a day for women who are not pregnant. 0-2 drinks a day for men. Know how much alcohol is in your drink. In the [place] one drink equals one 12 oz bottle of beer (355 mL), one 5 oz glass of wine (148 mL), or one 1 oz glass of hard liquor (44 mL). Stay at a healthy weight. If you need help losing weight, ask your doctor. General instructions  Follow instructions from your doctor about what you cannot eat or drink. Track your blood pressure at home. Tell your doctor about any changes. If you have diabetes, track your blood sugar. Exercise at least 30 minutes a day, 5 days a week. Keep your shots (vaccinations) up to date. Keep all follow-up visits. Where to find more information American Association of Kidney Patients: BombTimer.gl National Kidney Foundation: www.kidney.[place]: https://mathis.com/ Life  Options: www.lifeoptions.org Kidney School: www.kidneyschool.org Contact a doctor if: Your symptoms get worse. You get new symptoms. Get help right away if: You get symptoms of end-stage kidney disease. These include: Headaches. Losing feeling in your hands or feet. Easy bruising. Having hiccups often. Chest pain. Shortness of  breath. Lack of menstrual periods, in women. You have a fever. You make less pee than normal. You have pain or you bleed when you pee or poop. These symptoms may be an emergency. Get help right away. Call your local emergency services (911 in the [place] Do not wait to see if the symptoms will go away. Do not drive yourself to the hospital. Summary Chronic kidney disease is when lasting damage happens to the kidneys slowly over a long time. Causes of this disease include diabetes and high blood pressure. Often, there is no cure for this disease. Treatment can help symptoms and help keep the disease from getting worse. Treatment may involve lifestyle changes, medicines, and dialysis. This information is not intended to replace advice given to you by your health care provider. Make sure you discuss any questions you have with your health care provider. Document Revised: 07/12/2019 Document Reviewed: 07/12/2019 Elsevier Patient Education  [place].

## 2021-11-28 NOTE — Progress Notes (Signed)
Subjective: CC: 49-monthfollow-up PCP: GJanora Norlander DO HYWV:PXTGWSweaneyis a 71y.o. female presenting to clinic today for:  1.  Hyperlipidemia with hypertension Patient has not been taking her Crestor.  She denies any intolerance to the medication but rather is trying to adjust her lifestyle in efforts to reduce cholesterol levels.  No chest pain, shortness of breath or edema.  2.  Allergic bronchospasm Patient reports that BMemory Dancehas been helping and she has no concerns regarding this medication today.  3.  Knee pain Patient reports bilateral knee pain on the left.  This is intermittent and sometimes present when she is laying down.  She denies any pain with ambulation.  No weakness.  She walks twice a day every day to stay active.  She occasionally will use Tylenol which is helpful but she does not like to use medications regularly  ROS: Per HPI  No Known Allergies Past Medical History:  Diagnosis Date   Adenoma    PARATHYROID   Asthma    Colon polyp    Hypercalcemia    [place]   Hypertension    Parathyroid adenoma 2015    Current Outpatient Medications:    amLODipine (NORVASC) 2.5 MG tablet, Take 1 tablet (2.5 mg total) by mouth every morning. (NEEDS TO BE SEEN BEFORE NEXT REFILL), Disp: 30 tablet, Rfl: 0   BREO ELLIPTA 100-25 MCG/INH AEPB, USE 1 INHALATION DAILY, Disp: 60 each, Rfl: 12  Current Facility-Administered Medications:    0.9 %  sodium chloride infusion, 500 mL, Intravenous, Once, Nandigam, KVenia Minks MD Social History   Socioeconomic History   Marital status: Widowed    Spouse name: Not on file   Number of children: 2   Years of education: Not on file   Highest education level: Not on file  Occupational History   Not on file  Tobacco Use   Smoking status: Former    Types: Cigarettes    Quit date: 03/31/1973    Years since quitting: 48.6   Smokeless tobacco: Never  Vaping Use   Vaping Use: Never used  Substance  and Sexual Activity   Alcohol use: No   Drug use: No   Sexual activity: Not on file  Other Topics Concern   Not on file  Social History Narrative   Ms [place] daughter [place]  She had a son, who was disabled after he sustained TBI 2/2 meningitis, but he passed away FMarch 05, 2019   Social Determinants of Health   Financial Resource Strain: Not on file  Food Insecurity: Not on file  Transportation Needs: Not on file  Physical Activity: Not on file  Stress: Not on file  Social Connections: Not on file  Intimate Partner Violence: Not on file   Family History  Problem Relation Age of Onset   Hypertension Mother    Heart disease Mother    Hypertension Father    Diabetes Sister    Cancer Sister 526      Uterine Cancer had hysterectomy   Congestive Heart Failure Brother    Heart disease Brother    Heart disease Brother    Diabetes Brother    Seizures Brother    Hypertension Brother    Hyperlipidemia Brother    Seizures Son    Colon cancer Neg Hx    Colon polyps Neg Hx    Esophageal cancer Neg Hx    Stomach cancer Neg Hx    Rectal  cancer Neg Hx     Objective: Office vital signs reviewed. BP 138/63   Pulse 71   Temp 97.8 F (36.6 C)   Ht '5\' 2"'$  (1.575 m)   Wt 137 lb 3.2 oz (62.2 kg)   SpO2 97%   BMI 25.09 kg/m   Physical Examination:  General: Awake, alert, well nourished, No acute distress HEENT: Sclera white.  Moist mucous membranes.  TMs intact bilaterally with normal light reflex.  Nares clear.  Mild oropharyngeal erythema present Cardio: regular rate and rhythm, S1S2 heard, no murmurs appreciated Pulm: clear to auscultation bilaterally, no wheezes, rhonchi or rales; normal work of breathing on room air MSK: No joint effusion.  No tenderness to palpation to patella, patellar tendon, quads tendon or joint line.  No ligamentous laxity.  No palpable fullness in the posterior popliteal fossa  Assessment/ Plan: 71 y.o. 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  Okay to use Tylenol arthritis if she finds this to be helpful but would avoid NSAIDs given what appears to be CKD 3A.  Handout provided with icing techniques and home physical therapy exercises  Blood pressure is controlled.  Continue current regimen.  Rx has been renewed.  Check vitamin D, CBC, renal function  Check fasting lipid panel.  Not currently treated with Crestor but understands implications of uncontrolled cholesterol and risk of cardiovascular injury  Stable with Breo.  Refill sent  No orders of the defined types were placed in this encounter.  No orders of the defined types were placed in this encounter.    Janora Norlander, DO [place] [phone]

## 2021-12-11 DIAGNOSIS — Z23 Encounter for immunization: Secondary | ICD-10-CM | POA: Diagnosis not present

## 2021-12-16 ENCOUNTER — Encounter: Payer: Self-pay | Admitting: Gastroenterology

## 2022-01-01 ENCOUNTER — Ambulatory Visit (INDEPENDENT_AMBULATORY_CARE_PROVIDER_SITE_OTHER): Payer: Medicare HMO

## 2022-01-01 DIAGNOSIS — Z0000 Encounter for general adult medical examination without abnormal findings: Secondary | ICD-10-CM | POA: Diagnosis not present

## 2022-01-01 NOTE — Progress Notes (Signed)
MEDICARE ANNUAL WELLNESS VISIT  01/01/2022  Telephone Visit Disclaimer This Medicare AWV was conducted by telephone due to national recommendations for restrictions regarding the COVID-19 Pandemic (e.g. social distancing).  I verified, using two identifiers, that I am speaking with Kimberly Aguilar or their authorized healthcare agent. I discussed the limitations, risks, security, and privacy concerns of performing an evaluation and management service by telephone and the potential availability of an in-person appointment in the future. The patient expressed understanding and agreed to proceed.  Location of Patient: Home Location of Provider (nurse):  [facility]  Subjective:    Kimberly Aguilar is a 71 y.o. female patient of Kimberly Norlander, DO who had a Medicare Annual Wellness Visit today via telephone. Kimberly Aguilar is Retired and she and her two sisters share a home. She had two children but they both are now deceased. She reports that she is socially active and does interact with friends/family regularly. She is minimally physically active and enjoys walking and sewing.  Patient Care Team: Kimberly Norlander, DO as PCP - General (Family Medicine) Kimberly Pong, MD as Referring Physician (Internal Medicine) Kimberly Hudson, MD (Ophthalmology)     01/01/2022    3:24 PM 11/05/2020    9:07 AM 02/15/2018    3:29 PM 07/02/2016   10:58 AM 08/07/2013    8:27 AM 07/31/2013   12:11 PM  Advanced Directives  Does Patient Have a Medical Advance Directive? No No No No Patient does not have advance directive Patient does not have advance directive;Patient would like information  Would patient like information on creating a medical advance directive? No - Patient declined No - Patient declined Yes (MAU/Ambulatory/Procedural Areas - Information given)   Advance directive packet given    Hospital Utilization Over the Past 12 Months: # of hospitalizations or ER visits: 0 # of surgeries: 0  Review of  Systems    Patient reports that her overall health is unchanged compared to last year.  History obtained from chart review and the patient  Patient Reported Readings (BP, Pulse, CBG, Weight, etc) none  Pain Assessment Pain : No/denies pain     Current Medications & Allergies (verified) Allergies as of 01/01/2022   No Known Allergies      Medication List        Accurate as of January 01, 2022  3:36 PM. If you have any questions, ask your nurse or doctor.          amLODipine 2.5 MG tablet Commonly known as: NORVASC Take 1 tablet (2.5 mg total) by mouth every morning.   fluticasone furoate-vilanterol 100-25 MCG/ACT Aepb Commonly known as: BREO ELLIPTA Inhale 1 puff into the lungs daily.        History (reviewed): Past Medical History:  Diagnosis Date   Adenoma    PARATHYROID   Asthma    Colon polyp    Hypercalcemia    Hyperparathyroidism ([place]) 06/30/2013   Hypertension    Parathyroid adenoma 2015   Past Surgical History:  Procedure Laterality Date   COLONOSCOPY  2012   cyst removal from left breast     PARATHYROIDECTOMY Right 08/07/2013   Procedure: PARATHYROIDECTOMY;  Surgeon: [name], MD;  Location: [facility];  Service: General;  Laterality: Right;   Family History  Problem Relation Age of Onset   Hypertension Mother    Heart disease Mother    Hypertension Father    Diabetes Sister    Cancer Sister 73  Uterine Cancer had hysterectomy   Congestive Heart Failure Brother    Heart disease Brother    Heart disease Brother    Diabetes Brother    Seizures Brother    Hypertension Brother    Hyperlipidemia Brother    Seizures Son    Colon cancer Neg Hx    Colon polyps Neg Hx    Esophageal cancer Neg Hx    Stomach cancer Neg Hx    Rectal cancer Neg Hx    Social History   Socioeconomic History   Marital status: Widowed    Spouse name: Not on file   Number of children: 2   Years of education: Not on file   Highest education level:  Not on file  Occupational History   Not on file  Tobacco Use   Smoking status: Former    Types: Cigarettes    Quit date: 03/31/1973    Years since quitting: 48.7   Smokeless tobacco: Never  Vaping Use   Vaping Use: Never used  Substance and Sexual Activity   Alcohol use: No   Drug use: No   Sexual activity: Not on file  Other Topics Concern   Not on file  Social History Narrative   Kimberly Aguilar has a daughter who resides in [place]   She had a son, who was disabled after he sustained TBI 2/2 meningitis, but he passed away 06-14-17.   Social Determinants of Health   Financial Resource Strain: Not on file  Food Insecurity: Not on file  Transportation Needs: Not on file  Physical Activity: Not on file  Stress: Not on file  Social Connections: Not on file    Activities of Daily Living    01/01/2022    3:24 PM  In your present state of health, do you have any difficulty performing the following activities:  Hearing? 0  Vision? 0  Difficulty concentrating or making decisions? 0  Walking or climbing stairs? 0  Dressing or bathing? 0  Doing errands, shopping? 0  Preparing Food and eating ? N  Using the Toilet? N  In the past six months, have you accidently leaked urine? N  Do you have problems with loss of bowel control? N  Managing your Medications? N  Managing your Finances? N  Housekeeping or managing your Housekeeping? N    Patient Education/ Literacy How often do you need to have someone help you when you read instructions, pamphlets, or other written materials from your doctor or pharmacy?: 1 - Never What is the last grade level you completed in school?: 12th grade  Exercise Current Exercise Habits: Home exercise routine, Type of exercise: walking, Time (Minutes): 60, Frequency (Times/Week): 4, Weekly Exercise (Minutes/Week): 240, Intensity: Mild, Exercise limited by: None identified  Diet Patient reports consuming 2 meals a day and 1 snack(s) a day Patient  reports that her primary diet is: Regular Patient reports that she does have regular access to food.   Depression Screen    11/28/2021   11:21 AM 05/06/2021   10:49 AM 11/06/2020   11:28 AM 11/05/2020    9:16 AM 04/25/2020   10:39 AM 09/13/2019   11:18 AM 02/21/2019    1:16 PM  PHQ 2/9 Scores  PHQ - 2 Score 0 0 0 0 0 0 1  PHQ- 9 Score     0  1     Fall Risk    01/01/2022    3:35 PM 11/28/2021   11:21 AM 05/06/2021   10:49  AM 11/06/2020   11:28 AM 11/05/2020    9:16 AM  Fall Risk   Falls in the past year? 0 0 0 0 0  Follow up Falls evaluation completed    Falls evaluation completed     Objective:  Graciana Sessa seemed alert and oriented and she participated appropriately during our telephone visit.  Blood Pressure Weight BMI  BP Readings from Last 3 Encounters:  11/28/21 138/63  11/13/21 127/64  05/06/21 132/75   Wt Readings from Last 3 Encounters:  11/28/21 137 lb 3.2 oz (62.2 kg)  11/13/21 129 lb (58.5 kg)  10/23/21 129 lb (58.5 kg)   BMI Readings from Last 1 Encounters:  11/28/21 25.09 kg/m    *Unable to obtain current vital signs, weight, and BMI due to telephone visit type  Hearing/Vision  Ben did not seem to have difficulty with hearing/understanding during the telephone conversation Reports that she has had a formal eye exam by an eye care professional within the past year Reports that she has not had a formal hearing evaluation within the past year *Unable to fully assess hearing and vision during telephone visit type  Cognitive Function:    01/01/2022    3:30 PM 11/05/2020    9:13 AM  6CIT Screen  What Year? 0 points 0 points  What month? 0 points 0 points  What time? 0 points 0 points  Count back from 20 0 points 0 points  Months in reverse 0 points 0 points  Repeat phrase 0 points 2 points  Total Score 0 points 2 points   (Normal:0-7, Significant for Dysfunction: >8)  Normal Cognitive Function Screening: Yes   Immunization & Health Maintenance  Record Immunization History  Administered Date(s) Administered   Influenza,inj,Quad PF,6+ Mos 03/03/2020   Moderna Sars-Covid-2 Vaccination 06/12/2019, 07/11/2019, 03/21/2020   Td 09/25/2009    Health Maintenance  Topic Date Due   Zoster Vaccines- Shingrix (1 of 2) Never done   COVID-19 Vaccine (4 - Moderna series) 05/16/2020   Pneumonia Vaccine 78+ Years old (1 - PCV) 05/06/2022 (Originally 05/12/2016)   TETANUS/TDAP  05/06/2022 (Originally 01/24/2021)   Hepatitis C Screening  11/29/2022 (Originally 05/12/1968)   MAMMOGRAM  02/13/2022   COLONOSCOPY (Pts 45-38yr Insurance coverage will need to be confirmed)  11/13/2028   DEXA SCAN  Completed   HPV VACCINES  Aged Out   INFLUENZA VACCINE  Discontinued       Assessment  This is a routine wellness examination for JOwens & Minor  Health Maintenance: Due or Overdue Health Maintenance Due  Topic Date Due   Zoster Vaccines- Shingrix (1 of 2) Never done   COVID-19 Vaccine (4 - Moderna series) 05/16/2020    JLeeroy Bockdoes not need a referral for Community Assistance: Care Management:   no Social Work:    no Prescription Assistance:  no Nutrition/Diabetes Education:  no   Plan:  Personalized Goals  Goals Addressed             This Visit's Progress    Patient Stated       01/01/2022 AWV Goal: Fall Prevention  Over the next year, patient will decrease their risk for falls by: Using assistive devices, such as a cane or walker, as needed Identifying fall risks within their home and correcting them by: Removing throw rugs Adding handrails to stairs or ramps Removing clutter and keeping a clear pathway throughout the home Increasing light, especially at night Adding shower handles/bars Raising toilet seat Identifying potential personal risk factors for  falls: Medication side effects Incontinence/urgency Vestibular dysfunction Hearing loss Musculoskeletal disorders Neurological disorders Orthostatic hypotension          Personalized Health Maintenance & Screening Recommendations  Pneumococcal vaccine  Td vaccine Shingrix vaccine  Lung Cancer Screening Recommended: no (Low Dose CT Chest recommended if Age 13-80 years, 30 pack-year currently smoking OR have quit w/in past 15 years) Hepatitis C Screening recommended: no HIV Screening recommended: no  Advanced Directives: Written information was not prepared per patient's request.  Referrals & Orders No orders of the defined types were placed in this encounter.   Follow-up Plan Follow-up with Kimberly Norlander, DO as planned   I have personally reviewed and noted the followin in the patient's chart:   Medical and social history Use of alcohol, tobacco or illicit drugs  Current medications and supplements Functional ability and status Nutritional status Physical activity Advanced directives List of other physicians Hospitalizations, surgeries, and ER visits in previous 12 months Vitals Screenings to include cognitive, depression, and falls Referrals and appointments  In addition, I have reviewed and discussed with Kimberly Aguilar certain preventive protocols, quality metrics, and best practice recommendations. A written personalized care plan for preventive services as well as general preventive health recommendations is available and can be mailed to the patient at her request.      Burnadette Pop  01/01/2022   Patient declined after visit summary

## 2022-01-12 ENCOUNTER — Other Ambulatory Visit (HOSPITAL_COMMUNITY): Payer: Self-pay | Admitting: Family Medicine

## 2022-01-12 DIAGNOSIS — Z1231 Encounter for screening mammogram for malignant neoplasm of breast: Secondary | ICD-10-CM

## 2022-02-18 ENCOUNTER — Ambulatory Visit (HOSPITAL_COMMUNITY)
Admission: RE | Admit: 2022-02-18 | Discharge: 2022-02-18 | Disposition: A | Discharge status: Home / Self Care | Payer: Medicare HMO | Source: Ambulatory Visit | Attending: Family Medicine | Admitting: Family Medicine

## 2022-02-18 DIAGNOSIS — Z1231 Encounter for screening mammogram for malignant neoplasm of breast: Secondary | ICD-10-CM | POA: Insufficient documentation

## 2022-03-19 DIAGNOSIS — H40013 Open angle with borderline findings, low risk, bilateral: Secondary | ICD-10-CM | POA: Diagnosis not present

## 2022-03-19 DIAGNOSIS — H524 Presbyopia: Secondary | ICD-10-CM | POA: Diagnosis not present

## 2022-05-07 ENCOUNTER — Ambulatory Visit: Payer: Medicare HMO

## 2022-05-21 DIAGNOSIS — Z23 Encounter for immunization: Secondary | ICD-10-CM | POA: Diagnosis not present

## 2022-06-10 ENCOUNTER — Ambulatory Visit (INDEPENDENT_AMBULATORY_CARE_PROVIDER_SITE_OTHER): Payer: Medicare HMO | Admitting: Family Medicine

## 2022-06-10 ENCOUNTER — Encounter: Payer: Self-pay | Admitting: Family Medicine

## 2022-06-10 VITALS — BP 137/67 | HR 71 | Temp 98.6°F | Ht 62.0 in | Wt 130.0 lb

## 2022-06-10 DIAGNOSIS — E7800 Pure hypercholesterolemia, unspecified: Secondary | ICD-10-CM

## 2022-06-10 DIAGNOSIS — N1831 Chronic kidney disease, stage 3a: Secondary | ICD-10-CM | POA: Diagnosis not present

## 2022-06-10 DIAGNOSIS — M7632 Iliotibial band syndrome, left leg: Secondary | ICD-10-CM | POA: Diagnosis not present

## 2022-06-10 DIAGNOSIS — I10 Essential (primary) hypertension: Secondary | ICD-10-CM | POA: Diagnosis not present

## 2022-06-10 DIAGNOSIS — M25562 Pain in left knee: Secondary | ICD-10-CM | POA: Diagnosis not present

## 2022-06-10 DIAGNOSIS — I129 Hypertensive chronic kidney disease with stage 1 through stage 4 chronic kidney disease, or unspecified chronic kidney disease: Secondary | ICD-10-CM

## 2022-06-10 NOTE — Progress Notes (Signed)
Subjective: CC: Follow-up knee pain PCP: Janora Norlander, DO JB:[id number] Kimberly Aguilar is a 72 y.o. female presenting to clinic today for:  1.  Left-sided knee pain Patient reports that the lateral knee pain that she is experiencing 6 months ago improved with the home physical therapy that were provided.  However, recently she started having some discomfort in that left knee again and now is having some left anterior knee with some fullness associated.  2.  Hypertension associate with CKD 3A and hyperlipidemia Patient really has been trying to exercise and watch her intake.  She is compliant with amlodipine.  Wants to get a kidney function checked today because of family history of renal dysfunction that has resulted in HD in the past.  No chest pain, shortness of breath, visual disturbance or lower extremity swelling reported   ROS: Per HPI  No Known Allergies Past Medical History:  Diagnosis Date   Adenoma    PARATHYROID   Asthma    Colon polyp    Hypercalcemia    Hyperparathyroidism ([place]) 06/30/2013   Hypertension    Parathyroid adenoma 2015    Current Outpatient Medications:    amLODipine (NORVASC) 2.5 MG tablet, Take 1 tablet (2.5 mg total) by mouth every morning., Disp: 90 tablet, Rfl: 3   fluticasone furoate-vilanterol (BREO ELLIPTA) 100-25 MCG/ACT AEPB, Inhale 1 puff into the lungs daily., Disp: 60 each, Rfl: 11  Current Facility-Administered Medications:    0.9 %  sodium chloride infusion, 500 mL, Intravenous, Once, Nandigam, Venia Minks, MD Social History   Socioeconomic History   Marital status: Widowed    Spouse name: Not on file   Number of children: 2   Years of education: Not on file   Highest education level: Not on file  Occupational History   Not on file  Tobacco Use   Smoking status: Former    Types: Cigarettes    Quit date: 03/31/1973    Years since quitting: 49.2   Smokeless tobacco: Never  Vaping Use   Vaping Use: Never used  Substance and Sexual  Activity   Alcohol use: No   Drug use: No   Sexual activity: Not on file  Other Topics Concern   Not on file  Social History Narrative   Ms [place] has a daughter who resides in [place]   She had a son, who was disabled after he sustained TBI 2/2 meningitis, but he passed away 06/07/2017.   Social Determinants of Health   Financial Resource Strain: Not on file  Food Insecurity: Not on file  Transportation Needs: Not on file  Physical Activity: Not on file  Stress: Not on file  Social Connections: Not on file  Intimate Partner Violence: Not on file   Family History  Problem Relation Age of Onset   Hypertension Mother    Heart disease Mother    Hypertension Father    Diabetes Sister    Cancer Sister 11       Uterine Cancer had hysterectomy   Congestive Heart Failure Brother    Heart disease Brother    Heart disease Brother    Diabetes Brother    Seizures Brother    Hypertension Brother    Hyperlipidemia Brother    Seizures Son    Colon cancer Neg Hx    Colon polyps Neg Hx    Esophageal cancer Neg Hx    Stomach cancer Neg Hx    Rectal cancer Neg Hx     Objective: Office vital  signs reviewed. BP 137/67   Pulse 71   Temp 98.6 F (37 C)   Ht 5' 2"$  (1.575 m)   Wt 130 lb (59 kg)   SpO2 100%   BMI 23.78 kg/m   Physical Examination:  General: Awake, alert, well nourished, No acute distress HEENT: sclera white, MMM Cardio: regular rate and rhythm, S1S2 heard, no murmurs appreciated Pulm: clear to auscultation bilaterally, no wheezes, rhonchi or rales; normal work of breathing on room air Left knee: Ambulating independently with full active range of motion.  She has a palpable fullness along the left anterior knee.  Assessment/ Plan: 72 y.o. 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  Hopefully he can offer some insight and perhaps offer ultrasound if he feels that is clinically appropriate for the patient.  She has had some response to home physical therapy exercises but the knee pain is recurrent and now that she has this irregularity on the left anterior knee I would like it further evaluated  Fasting labs collected today.  Blood pressure is controlled with amlodipine.  Check renal function given history of CKD 3A noted previously.  Urine microalbumin and vitamin D also collected.  May need to consider transition over to ARB and/or addition of Farxiga to aid in renal function preservation.  Orders Placed This Encounter  Procedures   CMP14+EGFR   Lipid Panel   VITAMIN D 25 Hydroxy (Vit-D Deficiency, Fractures)   CBC   No orders of the defined types were placed in this encounter.    Janora Norlander, DO [place] [phone]

## 2022-06-10 NOTE — Patient Instructions (Signed)
Chronic Kidney Disease, Adult Chronic kidney disease is when lasting damage happens to the kidneys slowly over a long time. The kidneys help to: Make pee (urine). Make hormones. Keep the right amount of fluids and chemicals in the body. Most often, this disease does not go away. You must take steps to help keep the kidney damage from getting worse. If steps are not taken, the kidneys might stop working forever. What are the causes? Diabetes. High blood pressure. Diseases that affect the heart and blood vessels. Other kidney diseases. Diseases of the body's disease-fighting system. A problem with the flow of pee. Infections of the organs that make pee, store it, and take it out of the body. Swelling or irritation of your blood vessels. What increases the risk? Getting older. Having someone in your family who has kidney disease or kidney failure. Having a disease caused by genes. Taking medicines often that harm the kidneys. Being near or having contact with harmful substances. Being very overweight. Using tobacco now or in the past. What are the signs or symptoms? Feeling very tired. Having a swollen face, legs, ankles, or feet. Feeling like you may vomit or vomiting. Not feeling hungry. Being confused or not able to focus. Twitches and cramps in the leg muscles or other muscles. Dry, itchy skin. A taste of metal in your mouth. Making less pee, or making more pee. Shortness of breath. Trouble sleeping. You may also become anemic or get weak bones. Anemic means there is not enough red blood cells or hemoglobin in your blood. You may get symptoms slowly. You may not notice them until the kidney damage gets very bad. How is this treated? Often, there is no cure for this disease. Treatment can help with symptoms and help keep the disease from getting worse. You may need to: Avoid alcohol. Avoid foods that are high in salt, potassium, phosphorous, and protein. Take medicines for  symptoms and to help control other conditions. Have dialysis. This treatment gets harmful waste out of your body. Treat other problems that cause your kidney disease or make it worse. Follow these instructions at home: Medicines Take over-the-counter and prescription medicines only as told by your doctor. Do not take any new medicines, vitamins, or supplements unless your doctor says it is okay. Lifestyle  Do not smoke or use any products that contain nicotine or tobacco. If you need help quitting, ask your doctor. If you drink alcohol: Limit how much you use to: 0-1 drink a day for women who are not pregnant. 0-2 drinks a day for men. Know how much alcohol is in your drink. In the [place] one drink equals one 12 oz bottle of beer (355 mL), one 5 oz glass of wine (148 mL), or one 1 oz glass of hard liquor (44 mL). Stay at a healthy weight. If you need help losing weight, ask your doctor. General instructions  Follow instructions from your doctor about what you cannot eat or drink. Track your blood pressure at home. Tell your doctor about any changes. If you have diabetes, track your blood sugar. Exercise at least 30 minutes a day, 5 days a week. Keep your shots (vaccinations) up to date. Keep all follow-up visits. Where to find more information American Association of Kidney Patients: www.aakp.org National Kidney Foundation: www.kidney.org American Kidney Fund: www.akfinc.org Life Options: www.lifeoptions.org Kidney School: www.kidneyschool.org Contact a doctor if: Your symptoms get worse. You get new symptoms. Get help right away if: You get symptoms of end-stage kidney disease. These   include: Headaches. Losing feeling in your hands or feet. Easy bruising. Having hiccups often. Chest pain. Shortness of breath. Lack of menstrual periods, in women. You have a fever. You make less pee than normal. You have pain or you bleed when you pee or poop. These symptoms may be an  emergency. Get help right away. Call your local emergency services (911 in the [place] Do not wait to see if the symptoms will go away. Do not drive yourself to the hospital. Summary Chronic kidney disease is when lasting damage happens to the kidneys slowly over a long time. Causes of this disease include diabetes and high blood pressure. Often, there is no cure for this disease. Treatment can help symptoms and help keep the disease from getting worse. Treatment may involve lifestyle changes, medicines, and dialysis. This information is not intended to replace advice given to you by your health care provider. Make sure you discuss any questions you have with your health care provider. Document Revised: 07/12/2019 Document Reviewed: 07/12/2019 Elsevier Patient Education  2023 Elsevier Inc.  

## 2022-06-11 LAB — CMP14+EGFR
ALT: 9 IU/L (ref 0–32)
AST: 14 IU/L (ref 0–40)
Albumin/Globulin Ratio: 1.8 (ref 1.2–2.2)
Albumin: 4.4 g/dL (ref 3.8–4.8)
Alkaline Phosphatase: 61 IU/L (ref 44–121)
BUN/Creatinine Ratio: 16 (ref 12–28)
BUN: 15 mg/dL (ref 8–27)
Bilirubin Total: 0.4 mg/dL (ref 0.0–1.2)
CO2: 23 mmol/L (ref 20–29)
Calcium: 9.5 mg/dL (ref 8.7–10.3)
Chloride: 104 mmol/L (ref 96–106)
Creatinine, Ser: 0.94 mg/dL (ref 0.57–1.00)
Globulin, Total: 2.4 g/dL (ref 1.5–4.5)
Glucose: 99 mg/dL (ref 70–99)
Potassium: 4.3 mmol/L (ref 3.5–5.2)
Sodium: 141 mmol/L (ref 134–144)
Total Protein: 6.8 g/dL (ref 6.0–8.5)
eGFR: 65 mL/min/{1.73_m2} (ref >59)

## 2022-06-11 LAB — CBC
Hematocrit: 39.6 % (ref 34.0–46.6)
Hemoglobin: 12.8 g/dL (ref 11.1–15.9)
MCH: 30.3 pg (ref 26.6–33.0)
MCHC: 32.3 g/dL (ref 31.5–35.7)
MCV: 94 fL (ref 79–97)
Platelets: 284 10*3/uL (ref 150–450)
RBC: 4.23 x10E6/uL (ref 3.77–5.28)
RDW: 12.5 % (ref 11.7–15.4)
WBC: 3.1 10*3/uL — ABNORMAL LOW (ref 3.4–10.8)

## 2022-06-11 LAB — LIPID PANEL
Chol/HDL Ratio: 2.5 ratio (ref 0.0–4.4)
Cholesterol, Total: 241 mg/dL — ABNORMAL HIGH (ref 100–199)
HDL: 96 mg/dL (ref >39)
LDL Chol Calc (NIH): 137 mg/dL — ABNORMAL HIGH (ref 0–99)
Triglycerides: 49 mg/dL (ref 0–149)
VLDL Cholesterol Cal: 8 mg/dL (ref 5–40)

## 2022-06-11 LAB — VITAMIN D 25 HYDROXY (VIT D DEFICIENCY, FRACTURES): Vit D, 25-Hydroxy: 26.8 ng/mL — ABNORMAL LOW (ref 30.0–100.0)

## 2022-06-12 LAB — MICROALBUMIN / CREATININE URINE RATIO
Creatinine, Urine: 108.4 mg/dL
Microalb/Creat Ratio: 7 mg/g creat (ref 0–29)
Microalbumin, Urine: 7.9 ug/mL

## 2022-06-17 ENCOUNTER — Telehealth: Payer: Self-pay | Admitting: Family Medicine

## 2022-06-17 NOTE — Telephone Encounter (Signed)
Patient wants her referral sent to [facility] in [place] if possible since its closer than Dr Raeford Razor office. Please advise and call patient with update.

## 2022-06-17 NOTE — Telephone Encounter (Signed)
Can you reroute referral for sports med?

## 2022-06-24 NOTE — Telephone Encounter (Signed)
Kimberly Aguilar, can you continue to try and call this patient to let her know this and then route to [place] Sous[place] so she can redirect the referral?

## 2022-06-24 NOTE — Telephone Encounter (Signed)
Spoke to patient, she is going to contact Dr. Raeford Razor right after she gets off the phone with me and cancel her appointment.

## 2022-06-30 ENCOUNTER — Ambulatory Visit: Payer: Medicare HMO | Admitting: Family Medicine

## 2022-06-30 NOTE — Telephone Encounter (Signed)
Patient is scheduled with Dr. Darden Palmer office

## 2022-07-02 DIAGNOSIS — M25562 Pain in left knee: Secondary | ICD-10-CM | POA: Diagnosis not present

## 2022-07-02 DIAGNOSIS — M2392 Unspecified internal derangement of left knee: Secondary | ICD-10-CM | POA: Diagnosis not present

## 2022-07-02 DIAGNOSIS — M23007 Cystic meniscus, unspecified meniscus, left knee: Secondary | ICD-10-CM | POA: Diagnosis not present

## 2022-07-02 DIAGNOSIS — G8929 Other chronic pain: Secondary | ICD-10-CM | POA: Diagnosis not present

## 2022-07-02 DIAGNOSIS — M7989 Other specified soft tissue disorders: Secondary | ICD-10-CM | POA: Diagnosis not present

## 2022-07-16 DIAGNOSIS — G8929 Other chronic pain: Secondary | ICD-10-CM | POA: Diagnosis not present

## 2022-07-16 DIAGNOSIS — M25562 Pain in left knee: Secondary | ICD-10-CM | POA: Diagnosis not present

## 2022-07-16 DIAGNOSIS — M2392 Unspecified internal derangement of left knee: Secondary | ICD-10-CM | POA: Diagnosis not present

## 2022-07-16 DIAGNOSIS — M7989 Other specified soft tissue disorders: Secondary | ICD-10-CM | POA: Diagnosis not present

## 2022-07-16 DIAGNOSIS — M23007 Cystic meniscus, unspecified meniscus, left knee: Secondary | ICD-10-CM | POA: Diagnosis not present

## 2022-07-16 DIAGNOSIS — M2410 Other articular cartilage disorders, unspecified site: Secondary | ICD-10-CM | POA: Diagnosis not present

## 2022-07-23 DIAGNOSIS — G8929 Other chronic pain: Secondary | ICD-10-CM | POA: Diagnosis not present

## 2022-07-23 DIAGNOSIS — M7989 Other specified soft tissue disorders: Secondary | ICD-10-CM | POA: Diagnosis not present

## 2022-07-23 DIAGNOSIS — M1712 Unilateral primary osteoarthritis, left knee: Secondary | ICD-10-CM | POA: Diagnosis not present

## 2022-07-23 DIAGNOSIS — M25562 Pain in left knee: Secondary | ICD-10-CM | POA: Diagnosis not present

## 2022-07-23 DIAGNOSIS — M23007 Cystic meniscus, unspecified meniscus, left knee: Secondary | ICD-10-CM | POA: Diagnosis not present

## 2022-08-03 ENCOUNTER — Encounter: Payer: Self-pay | Admitting: *Deleted

## 2022-08-04 ENCOUNTER — Telehealth: Payer: Self-pay | Admitting: Family Medicine

## 2022-08-04 NOTE — Telephone Encounter (Signed)
Called patient to schedule Medicare Annual Wellness Visit (AWV). No voicemail available to leave a message.   Patient has [employer] (Calendar) can be scheduled sooner than  01/03/2023   Last date of AWV: 01/01/2022  Please schedule an appointment at any time with either Vernona Rieger or [place], [facility]. .  If any questions, please contact me at [phone].  Thank you,  Judeth Cornfield,  AMB Clinical Support [facility] AWV Program Direct Dial ??[phone]

## 2022-08-24 ENCOUNTER — Telehealth: Payer: Self-pay | Admitting: Family Medicine

## 2022-08-24 DIAGNOSIS — M1712 Unilateral primary osteoarthritis, left knee: Secondary | ICD-10-CM | POA: Diagnosis not present

## 2022-08-24 DIAGNOSIS — M23007 Cystic meniscus, unspecified meniscus, left knee: Secondary | ICD-10-CM | POA: Diagnosis not present

## 2022-08-24 NOTE — Telephone Encounter (Signed)
Contacted Shauntina Liebmann to schedule their annual wellness visit. Appointment made for 09/02/2022.  Thank you,  Judeth Cornfield,  AMB Clinical Support [facility] AWV Program Direct Dial ??[phone]

## 2022-09-02 ENCOUNTER — Ambulatory Visit (INDEPENDENT_AMBULATORY_CARE_PROVIDER_SITE_OTHER): Payer: Medicare HMO

## 2022-09-02 VITALS — Ht 62.0 in | Wt 128.0 lb

## 2022-09-02 DIAGNOSIS — Z0000 Encounter for general adult medical examination without abnormal findings: Secondary | ICD-10-CM

## 2022-09-02 NOTE — Progress Notes (Signed)
Subjective:   Perfecta Dufault is a 72 y.o. female who presents for Medicare Annual (Subsequent) preventive examination. I connected with  Adolphus Birchwood on 09/02/22 by a audio enabled telemedicine application and verified that I am speaking with the correct person using two identifiers.  Patient Location: Home  Provider Location: Home Office  I discussed the limitations of evaluation and management by telemedicine. The patient expressed understanding and agreed to proceed.  Review of Systems     Cardiac Risk Factors include: advanced age (>38men, >5 women);hypertension     Objective:    Today's Vitals   09/02/22 1451  Weight: 128 lb (58.1 kg)  Height: 5\' 2"  (1.575 m)   Body mass index is 23.41 kg/m.     09/02/2022    2:54 PM 01/01/2022    3:24 PM 11/05/2020    9:07 AM 02/15/2018    3:29 PM 07/02/2016   10:58 AM 08/07/2013    8:27 AM 07/31/2013   12:11 PM  Advanced Directives  Does Patient Have a Medical Advance Directive? No No No No No Patient does not have advance directive Patient does not have advance directive;Patient would like information  Would patient like information on creating a medical advance directive? No - Patient declined No - Patient declined No - Patient declined Yes (MAU/Ambulatory/Procedural Areas - Information given)   Advance directive packet given    Current Medications (verified) Outpatient Encounter Medications as of 09/02/2022  Medication Sig   amLODipine (NORVASC) 2.5 MG tablet Take 1 tablet (2.5 mg total) by mouth every morning.   cholecalciferol (VITAMIN D3) 25 MCG (1000 UNIT) tablet Take 1,000 Units by mouth daily.   fluticasone furoate-vilanterol (BREO ELLIPTA) 100-25 MCG/ACT AEPB Inhale 1 puff into the lungs daily.   Facility-Administered Encounter Medications as of 09/02/2022  Medication   0.9 %  sodium chloride infusion    Allergies (verified) Patient has no known allergies.   History: Past Medical History:  Diagnosis Date   Adenoma     PARATHYROID   Asthma    Colon polyp    Hypercalcemia    Hyperparathyroidism (HCC) 06/30/2013   Hypertension    Parathyroid adenoma 2015   Past Surgical History:  Procedure Laterality Date   COLONOSCOPY  2012   cyst removal from left breast     PARATHYROIDECTOMY Right 08/07/2013   Procedure: PARATHYROIDECTOMY;  Surgeon: Axel Filler, MD;  Location: [facility];  Service: General;  Laterality: Right;   Family History  Problem Relation Age of Onset   Hypertension Mother    Heart disease Mother    Hypertension Father    Diabetes Sister    Cancer Sister 55       Uterine Cancer had hysterectomy   Congestive Heart Failure Brother    Heart disease Brother    Heart disease Brother    Diabetes Brother    Seizures Brother    Hypertension Brother    Hyperlipidemia Brother    Seizures Son    Colon cancer Neg Hx    Colon polyps Neg Hx    Esophageal cancer Neg Hx    Stomach cancer Neg Hx    Rectal cancer Neg Hx    Social History   Socioeconomic History   Marital status: Widowed    Spouse name: Not on file   Number of children: 2   Years of education: Not on file   Highest education level: Not on file  Occupational History   Not on file  Tobacco Use   Smoking  status: Former    Types: Cigarettes    Quit date: 03/31/1973    Years since quitting: 49.4   Smokeless tobacco: Never  Vaping Use   Vaping Use: Never used  Substance and Sexual Activity   Alcohol use: No   Drug use: No   Sexual activity: Not on file  Other Topics Concern   Not on file  Social History Narrative   Ms Maggiacomo has a daughter who resides in [place]   She had a son, who was disabled after he sustained TBI 2/2 meningitis, but he passed away June 12, 2017.   Social Determinants of Health   Financial Resource Strain: Low Risk  (09/02/2022)   Overall Financial Resource Strain (CARDIA)    Difficulty of Paying Living Expenses: Not hard at all  Food Insecurity: No Food Insecurity (09/02/2022)   Hunger Vital  Sign    Worried About Running Out of Food in the Last Year: Never true    Ran Out of Food in the Last Year: Never true  Transportation Needs: No Transportation Needs (09/02/2022)   PRAPARE - Administrator, Civil Service (Medical): No    Lack of Transportation (Non-Medical): No  Physical Activity: Insufficiently Active (09/02/2022)   Exercise Vital Sign    Days of Exercise per Week: 3 days    Minutes of Exercise per Session: 30 min  Stress: No Stress Concern Present (09/02/2022)   [employer] of Occupational Health - Occupational Stress Questionnaire    Feeling of Stress : Not at all  Social Connections: Moderately Isolated (09/02/2022)   Social Connection and Isolation Panel [NHANES]    Frequency of Communication with Friends and Family: More than three times a week    Frequency of Social Gatherings with Friends and Family: More than three times a week    Attends Religious Services: More than 4 times per year    Active Member of [employer] or Organizations: No    Attends Banker Meetings: Never    Marital Status: Widowed    Tobacco Counseling Counseling given: Not Answered   Clinical Intake:  Pre-visit preparation completed: Yes  Pain : No/denies pain     Nutritional Risks: None Diabetes: No  How often do you need to have someone help you when you read instructions, pamphlets, or other written materials from your doctor or pharmacy?: 1 - Never  Diabetic?no   Interpreter Needed?: No  Information entered by :: Renie Ora, LPN   Activities of Daily Living    09/02/2022    2:55 PM 09/02/2022    2:54 PM  In your present state of health, do you have any difficulty performing the following activities:  Hearing? 0 0  Vision? 0 0  Difficulty concentrating or making decisions? 0 0  Walking or climbing stairs? 0 0  Dressing or bathing? 0 0  Doing errands, shopping? 0 0  Preparing Food and eating ? N N  Using the Toilet? N N  In the past six  months, have you accidently leaked urine? N N  Do you have problems with loss of bowel control? N N  Managing your Medications? N N  Managing your Finances? N N  Housekeeping or managing your Housekeeping? N N    Patient Care Team: Raliegh Ip, DO as PCP - General (Family Medicine) Marti Sleigh, MD as Referring Physician (Internal Medicine) Pasty Arch, MD (Ophthalmology)  Indicate any recent Medical Services you may have received from other than [facility] providers in  the past year (date may be approximate).     Assessment:   This is a routine wellness examination for Ramon.  Hearing/Vision screen Vision Screening - Comments:: Wears rx glasses - up to date with routine eye exams with  Dr.Davis   Dietary issues and exercise activities discussed: Current Exercise Habits: Home exercise routine, Type of exercise: walking, Time (Minutes): 30, Frequency (Times/Week): 3, Weekly Exercise (Minutes/Week): 90, Intensity: Mild, Exercise limited by: None identified   Goals Addressed             This Visit's Progress    Exercise 3x per week (30 min per time)         Depression Screen    09/02/2022    2:53 PM 06/10/2022   10:53 AM 01/01/2022    3:35 PM 11/28/2021   11:21 AM 05/06/2021   10:49 AM 11/06/2020   11:28 AM 11/05/2020    9:16 AM  PHQ 2/9 Scores  PHQ - 2 Score 0 0 0 0 0 0 0  PHQ- 9 Score  0         Fall Risk    09/02/2022    2:52 PM 06/10/2022   10:57 AM 06/10/2022   10:53 AM 01/01/2022    3:35 PM 11/28/2021   11:21 AM  Fall Risk   Falls in the past year? 0 1 0 0 0  Number falls in past yr: 0 0 0    Injury with Fall? 0 1 0    Risk for fall due to : No Fall Risks No Fall Risks No Fall Risks    Follow up Falls prevention discussed Education provided Education provided Falls evaluation completed     FALL RISK PREVENTION PERTAINING TO THE HOME:  Any stairs in or around the home? No  If so, are there any without handrails? No  Home free of loose throw rugs  in walkways, pet beds, electrical cords, etc? Yes  Adequate lighting in your home to reduce risk of falls? Yes   ASSISTIVE DEVICES UTILIZED TO PREVENT FALLS:  Life alert? No  Use of a cane, walker or w/c? No  Grab bars in the bathroom? No  Shower chair or bench in shower? No  Elevated toilet seat or a handicapped toilet? No       02/15/2018    4:22 PM  MMSE - Mini Mental State Exam  Orientation to time 5  Orientation to Place 5  Registration 3  Attention/ Calculation 5  Recall 3  Language- name 2 objects 2  Language- repeat 1  Language- follow 3 step command 3  Language- read & follow direction 1  Write a sentence 1  Copy design 1  Total score 30        09/02/2022    2:55 PM 01/01/2022    3:30 PM 11/05/2020    9:13 AM  6CIT Screen  What Year? 0 points 0 points 0 points  What month? 0 points 0 points 0 points  What time? 0 points 0 points 0 points  Count back from 20 0 points 0 points 0 points  Months in reverse 0 points 0 points 0 points  Repeat phrase 0 points 0 points 2 points  Total Score 0 points 0 points 2 points    Immunizations Immunization History  Administered Date(s) Administered   Influenza,inj,Quad PF,6+ Mos 03/03/2020   Moderna Sars-Covid-2 Vaccination 06/12/2019, 07/11/2019, 03/21/2020   Td 09/25/2009    TDAP status: Due, Education has been provided regarding the importance  of this vaccine. Advised may receive this vaccine at local pharmacy or Health Dept. Aware to provide a copy of the vaccination record if obtained from local pharmacy or Health Dept. Verbalized acceptance and understanding.  Flu Vaccine status: Declined, Education has been provided regarding the importance of this vaccine but patient still declined. Advised may receive this vaccine at local pharmacy or Health Dept. Aware to provide a copy of the vaccination record if obtained from local pharmacy or Health Dept. Verbalized acceptance and understanding.  Pneumococcal vaccine status:  Due, Education has been provided regarding the importance of this vaccine. Advised may receive this vaccine at local pharmacy or Health Dept. Aware to provide a copy of the vaccination record if obtained from local pharmacy or Health Dept. Verbalized acceptance and understanding.  Covid-19 vaccine status: Completed vaccines  Qualifies for Shingles Vaccine? Yes   Zostavax completed No   Shingrix Completed?: No.    Education has been provided regarding the importance of this vaccine. Patient has been advised to call insurance company to determine out of pocket expense if they have not yet received this vaccine. Advised may also receive vaccine at local pharmacy or Health Dept. Verbalized acceptance and understanding.  Screening Tests Health Maintenance  Topic Date Due   COVID-19 Vaccine (4 - 2023-24 season) 12/19/2021   Zoster Vaccines- Shingrix (1 of 2) 09/08/2022 (Originally 05/12/2001)   Hepatitis C Screening  11/29/2022 (Originally 05/12/1968)   Pneumonia Vaccine 16+ Years old (1 of 1 - PCV) 06/11/2023 (Originally 05/12/2016)   INFLUENZA VACCINE  11/19/2022   MAMMOGRAM  02/19/2023   Medicare Annual Wellness (AWV)  09/02/2023   COLONOSCOPY (Pts 45-72yrs Insurance coverage will need to be confirmed)  11/13/2028   DEXA SCAN  Completed   HPV VACCINES  Aged Out   DTaP/Tdap/Td  Discontinued    Health Maintenance  Health Maintenance Due  Topic Date Due   COVID-19 Vaccine (4 - 2023-24 season) 12/19/2021    Colorectal cancer screening: Type of screening: Colonoscopy. Completed 11/13/2021. Repeat every 7 years  Mammogram status: Completed 02/18/2022. Repeat every year  Bone Density status: Completed 11/08/2020. Results reflect: Bone density results: OSTEOPENIA. Repeat every 5 years.  Lung Cancer Screening: (Low Dose CT Chest recommended if Age 25-80 years, 30 pack-year currently smoking OR have quit w/in 15years.) does not qualify.   Lung Cancer Screening Referral: n/a  Additional  Screening:  Hepatitis C Screening: does not qualify;   Vision Screening: Recommended annual ophthalmology exams for early detection of glaucoma and other disorders of the eye. Is the patient up to date with their annual eye exam?  Yes  Who is the provider or what is the name of the office in which the patient attends annual eye exams? Dr.Davis  If pt is not established with a provider, would they like to be referred to a provider to establish care? No .   Dental Screening: Recommended annual dental exams for proper oral hygiene  Community Resource Referral / Chronic Care Management: CRR required this visit?  No   CCM required this visit?  No      Plan:     I have personally reviewed and noted the following in the patient's chart:   Medical and social history Use of alcohol, tobacco or illicit drugs  Current medications and supplements including opioid prescriptions. Patient is not currently taking opioid prescriptions. Functional ability and status Nutritional status Physical activity Advanced directives List of other physicians Hospitalizations, surgeries, and ER visits in previous 12 months Vitals  Screenings to include cognitive, depression, and falls Referrals and appointments  In addition, I have reviewed and discussed with patient certain preventive protocols, quality metrics, and best practice recommendations. A written personalized care plan for preventive services as well as general preventive health recommendations were provided to patient.     Lorrene Reid, LPN   1/61/0960   Nurse Notes: Due TDAP Yevonne Aline Vaccine

## 2022-09-02 NOTE — Patient Instructions (Signed)
Kimberly Aguilar , Thank you for taking time to come for your Medicare Wellness Visit. I appreciate your ongoing commitment to your health goals. Please review the following plan we discussed and let me know if I can assist you in the future.   These are the goals we discussed:  Goals       Cut out extra servings (pt-stated)      Reduce extra snacking at night after supper.       Exercise 3x per week (30 min per time)      Patient Stated      11/05/2020 AWV Goal: Fall Prevention  Over the next year, patient will decrease their risk for falls by: Using assistive devices, such as a cane or walker, as needed Identifying fall risks within their home and correcting them by: Removing throw rugs Adding handrails to stairs or ramps Removing clutter and keeping a clear pathway throughout the home Increasing light, especially at night Adding shower handles/bars Raising toilet seat Identifying potential personal risk factors for falls: Medication side effects Incontinence/urgency Vestibular dysfunction Hearing loss Musculoskeletal disorders Neurological disorders Orthostatic hypotension        Patient Stated      01/01/2022 AWV Goal: Fall Prevention  Over the next year, patient will decrease their risk for falls by: Using assistive devices, such as a cane or walker, as needed Identifying fall risks within their home and correcting them by: Removing throw rugs Adding handrails to stairs or ramps Removing clutter and keeping a clear pathway throughout the home Increasing light, especially at night Adding shower handles/bars Raising toilet seat Identifying potential personal risk factors for falls: Medication side effects Incontinence/urgency Vestibular dysfunction Hearing loss Musculoskeletal disorders Neurological disorders Orthostatic hypotension          This is a list of the screening recommended for you and due dates:  Health Maintenance  Topic Date Due   COVID-19  Vaccine (4 - 2023-24 season) 12/19/2021   Zoster (Shingles) Vaccine (1 of 2) 09/08/2022*   Hepatitis C Screening: USPSTF Recommendation to screen - Ages 18-79 yo.  11/29/2022*   Pneumonia Vaccine (1 of 1 - PCV) 06/11/2023*   Flu Shot  11/19/2022   Mammogram  02/19/2023   Medicare Annual Wellness Visit  09/02/2023   Colon Cancer Screening  11/13/2028   DEXA scan (bone density measurement)  Completed   HPV Vaccine  Aged Out   DTaP/Tdap/Td vaccine  Discontinued  *Topic was postponed. The date shown is not the original due date.    Advanced directives: Advance directive discussed with you today. I have provided a copy for you to complete at home and have notarized. Once this is complete please bring a copy in to our office so we can scan it into your chart.   Conditions/risks identified: Aim for 30 minutes of exercise or brisk walking, 6-8 glasses of water, and 5 servings of fruits and vegetables each day.   Next appointment: Follow up in one year for your annual wellness visit    Preventive Care 65 Years and Older, Female Preventive care refers to lifestyle choices and visits with your health care provider that can promote health and wellness. What does preventive care include? A yearly physical exam. This is also called an annual well check. Dental exams once or twice a year. Routine eye exams. Ask your health care provider how often you should have your eyes checked. Personal lifestyle choices, including: Daily care of your teeth and gums. Regular physical activity. Eating a  healthy diet. Avoiding tobacco and drug use. Limiting alcohol use. Practicing safe sex. Taking low-dose aspirin every day. Taking vitamin and mineral supplements as recommended by your health care provider. What happens during an annual well check? The services and screenings done by your health care provider during your annual well check will depend on your age, overall health, lifestyle risk factors, and  family history of disease. Counseling  Your health care provider may ask you questions about your: Alcohol use. Tobacco use. Drug use. Emotional well-being. Home and relationship well-being. Sexual activity. Eating habits. History of falls. Memory and ability to understand (cognition). Work and work Astronomer. Reproductive health. Screening  You may have the following tests or measurements: Height, weight, and BMI. Blood pressure. Lipid and cholesterol levels. These may be checked every 5 years, or more frequently if you are over 88 years old. Skin check. Lung cancer screening. You may have this screening every year starting at age 61 if you have a 30-pack-year history of smoking and currently smoke or have quit within the past 15 years. Fecal occult blood test (FOBT) of the stool. You may have this test every year starting at age 47. Flexible sigmoidoscopy or colonoscopy. You may have a sigmoidoscopy every 5 years or a colonoscopy every 10 years starting at age 38. Hepatitis C blood test. Hepatitis B blood test. Sexually transmitted disease (STD) testing. Diabetes screening. This is done by checking your blood sugar (glucose) after you have not eaten for a while (fasting). You may have this done every 1-3 years. Bone density scan. This is done to screen for osteoporosis. You may have this done starting at age 78. Mammogram. This may be done every 1-2 years. Talk to your health care provider about how often you should have regular mammograms. Talk with your health care provider about your test results, treatment options, and if necessary, the need for more tests. Vaccines  Your health care provider may recommend certain vaccines, such as: Influenza vaccine. This is recommended every year. Tetanus, diphtheria, and acellular pertussis (Tdap, Td) vaccine. You may need a Td booster every 10 years. Zoster vaccine. You may need this after age 28. Pneumococcal 13-valent conjugate  (PCV13) vaccine. One dose is recommended after age 17. Pneumococcal polysaccharide (PPSV23) vaccine. One dose is recommended after age 54. Talk to your health care provider about which screenings and vaccines you need and how often you need them. This information is not intended to replace advice given to you by your health care provider. Make sure you discuss any questions you have with your health care provider. Document Released: 05/03/2015 Document Revised: 12/25/2015 Document Reviewed: 02/05/2015 Elsevier Interactive Patient Education  2017 [employer].  Fall Prevention in the Home Falls can cause injuries. They can happen to people of all ages. There are many things you can do to make your home safe and to help prevent falls. What can I do on the outside of my home? Regularly fix the edges of walkways and driveways and fix any cracks. Remove anything that might make you trip as you walk through a door, such as a raised step or threshold. Trim any bushes or trees on the path to your home. Use bright outdoor lighting. Clear any walking paths of anything that might make someone trip, such as rocks or tools. Regularly check to see if handrails are loose or broken. Make sure that both sides of any steps have handrails. Any raised decks and porches should have guardrails on the edges. Have  any leaves, snow, or ice cleared regularly. Use sand or salt on walking paths during winter. Clean up any spills in your garage right away. This includes oil or grease spills. What can I do in the bathroom? Use night lights. Install grab bars by the toilet and in the tub and shower. Do not use towel bars as grab bars. Use non-skid mats or decals in the tub or shower. If you need to sit down in the shower, use a plastic, non-slip stool. Keep the floor dry. Clean up any water that spills on the floor as soon as it happens. Remove soap buildup in the tub or shower regularly. Attach bath mats securely with  double-sided non-slip rug tape. Do not have throw rugs and other things on the floor that can make you trip. What can I do in the bedroom? Use night lights. Make sure that you have a light by your bed that is easy to reach. Do not use any sheets or blankets that are too big for your bed. They should not hang down onto the floor. Have a firm chair that has side arms. You can use this for support while you get dressed. Do not have throw rugs and other things on the floor that can make you trip. What can I do in the kitchen? Clean up any spills right away. Avoid walking on wet floors. Keep items that you use a lot in easy-to-reach places. If you need to reach something above you, use a strong step stool that has a grab bar. Keep electrical cords out of the way. Do not use floor polish or wax that makes floors slippery. If you must use wax, use non-skid floor wax. Do not have throw rugs and other things on the floor that can make you trip. What can I do with my stairs? Do not leave any items on the stairs. Make sure that there are handrails on both sides of the stairs and use them. Fix handrails that are broken or loose. Make sure that handrails are as long as the stairways. Check any carpeting to make sure that it is firmly attached to the stairs. Fix any carpet that is loose or worn. Avoid having throw rugs at the top or bottom of the stairs. If you do have throw rugs, attach them to the floor with carpet tape. Make sure that you have a light switch at the top of the stairs and the bottom of the stairs. If you do not have them, ask someone to add them for you. What else can I do to help prevent falls? Wear shoes that: Do not have high heels. Have rubber bottoms. Are comfortable and fit you well. Are closed at the toe. Do not wear sandals. If you use a stepladder: Make sure that it is fully opened. Do not climb a closed stepladder. Make sure that both sides of the stepladder are locked  into place. Ask someone to hold it for you, if possible. Clearly mark and make sure that you can see: Any grab bars or handrails. First and last steps. Where the edge of each step is. Use tools that help you move around (mobility aids) if they are needed. These include: Canes. Walkers. Scooters. Crutches. Turn on the lights when you go into a dark area. Replace any light bulbs as soon as they burn out. Set up your furniture so you have a clear path. Avoid moving your furniture around. If any of your floors are uneven, fix them.  If there are any pets around you, be aware of where they are. Review your medicines with your doctor. Some medicines can make you feel dizzy. This can increase your chance of falling. Ask your doctor what other things that you can do to help prevent falls. This information is not intended to replace advice given to you by your health care provider. Make sure you discuss any questions you have with your health care provider. Document Released: 01/31/2009 Document Revised: 09/12/2015 Document Reviewed: 05/11/2014 Elsevier Interactive Patient Education  2017 [employer].

## 2022-11-14 ENCOUNTER — Other Ambulatory Visit: Payer: Self-pay | Admitting: Family Medicine

## 2022-11-14 DIAGNOSIS — J4520 Mild intermittent asthma, uncomplicated: Secondary | ICD-10-CM

## 2022-12-04 ENCOUNTER — Other Ambulatory Visit: Payer: Self-pay | Admitting: Family Medicine

## 2022-12-04 DIAGNOSIS — I129 Hypertensive chronic kidney disease with stage 1 through stage 4 chronic kidney disease, or unspecified chronic kidney disease: Secondary | ICD-10-CM

## 2022-12-16 ENCOUNTER — Other Ambulatory Visit: Payer: Self-pay | Admitting: Family Medicine

## 2022-12-16 DIAGNOSIS — J4520 Mild intermittent asthma, uncomplicated: Secondary | ICD-10-CM

## 2022-12-29 ENCOUNTER — Ambulatory Visit (INDEPENDENT_AMBULATORY_CARE_PROVIDER_SITE_OTHER): Payer: Medicare HMO | Admitting: Family Medicine

## 2022-12-29 ENCOUNTER — Encounter: Payer: Self-pay | Admitting: Family Medicine

## 2022-12-29 VITALS — BP 116/61 | HR 76 | Temp 98.5°F | Ht 62.0 in | Wt 131.0 lb

## 2022-12-29 DIAGNOSIS — E7800 Pure hypercholesterolemia, unspecified: Secondary | ICD-10-CM

## 2022-12-29 DIAGNOSIS — I10 Essential (primary) hypertension: Secondary | ICD-10-CM

## 2022-12-29 DIAGNOSIS — N1831 Chronic kidney disease, stage 3a: Secondary | ICD-10-CM

## 2022-12-29 DIAGNOSIS — J4520 Mild intermittent asthma, uncomplicated: Secondary | ICD-10-CM

## 2022-12-29 DIAGNOSIS — I129 Hypertensive chronic kidney disease with stage 1 through stage 4 chronic kidney disease, or unspecified chronic kidney disease: Secondary | ICD-10-CM | POA: Diagnosis not present

## 2022-12-29 DIAGNOSIS — M25562 Pain in left knee: Secondary | ICD-10-CM

## 2022-12-29 MED ORDER — FLUTICASONE FUROATE-VILANTEROL 100-25 MCG/ACT IN AEPB: 1.0000 | INHALATION_SPRAY | Freq: Every day | RESPIRATORY_TRACT | 3 refills | Status: DC

## 2022-12-29 MED ORDER — AMLODIPINE BESYLATE 2.5 MG PO TABS: 2.5000 mg | ORAL_TABLET | Freq: Every morning | ORAL | 3 refills | Status: DC

## 2022-12-29 NOTE — Progress Notes (Signed)
Subjective: CC: Chronic follow-up PCP: Raliegh Ip, DO Kimberly Aguilar is a 72 y.o. female presenting to clinic today for:  1.  Hypertension associated with CKD 3A Patient compliant with Norvasc.  No chest pain, shortness of breath, dizziness reported.  Her last renal function panel actually showed normalization of her renal function.  She denies any fluid retention  2.  Left knee pain Patient reports quite a bit of improvement in the left knee pain.  She occasionally does get some generalized knee pain with locking but nothing like it was before.  She is under the care of orthopedics if needed.   ROS: Per HPI  No Known Allergies Past Medical History:  Diagnosis Date   Adenoma    PARATHYROID   Asthma    Colon polyp    Hypercalcemia    Hyperparathyroidism (HCC) 06/30/2013   Hypertension    Parathyroid adenoma 2015    Current Outpatient Medications:    cholecalciferol (VITAMIN D3) 25 MCG (1000 UNIT) tablet, Take 1,000 Units by mouth daily., Disp: , Rfl:    amLODipine (NORVASC) 2.5 MG tablet, Take 1 tablet (2.5 mg total) by mouth every morning., Disp: 90 tablet, Rfl: 3   fluticasone furoate-vilanterol (BREO ELLIPTA) 100-25 MCG/ACT AEPB, Inhale 1 puff into the lungs daily., Disp: 180 each, Rfl: 3  Current Facility-Administered Medications:    0.9 %  sodium chloride infusion, 500 mL, Intravenous, Once, Nandigam, Eleonore Chiquito, MD Social History   Socioeconomic History   Marital status: Widowed    Spouse name: Not on file   Number of children: 2   Years of education: Not on file   Highest education level: Not on file  Occupational History   Not on file  Tobacco Use   Smoking status: Former    Current packs/day: 0.00    Types: Cigarettes    Quit date: 03/31/1973    Years since quitting: 49.7   Smokeless tobacco: Never  Vaping Use   Vaping status: Never Used  Substance and Sexual Activity   Alcohol use: No   Drug use: No   Sexual activity: Not on file  Other  Topics Concern   Not on file  Social History Narrative   Ms Seaver has a daughter who resides in [place]   She had a son, who was disabled after he sustained TBI 2/2 meningitis, but he passed away 06/27/17.   Social Determinants of Health   Financial Resource Strain: Low Risk  (09/02/2022)   Overall Financial Resource Strain (CARDIA)    Difficulty of Paying Living Expenses: Not hard at all  Food Insecurity: No Food Insecurity (09/02/2022)   Hunger Vital Sign    Worried About Running Out of Food in the Last Year: Never true    Ran Out of Food in the Last Year: Never true  Transportation Needs: No Transportation Needs (09/02/2022)   PRAPARE - Administrator, Civil Service (Medical): No    Lack of Transportation (Non-Medical): No  Physical Activity: Insufficiently Active (09/02/2022)   Exercise Vital Sign    Days of Exercise per Week: 3 days    Minutes of Exercise per Session: 30 min  Stress: No Stress Concern Present (09/02/2022)   [employer] of Occupational Health - Occupational Stress Questionnaire    Feeling of Stress : Not at all  Social Connections: Moderately Isolated (09/02/2022)   Social Connection and Isolation Panel [NHANES]    Frequency of Communication with Friends and Family: More than three times a  week    Frequency of Social Gatherings with Friends and Family: More than three times a week    Attends Religious Services: More than 4 times per year    Active Member of [employer] or Organizations: No    Attends Banker Meetings: Never    Marital Status: Widowed  Intimate Partner Violence: Not At Risk (09/02/2022)   Humiliation, Afraid, Rape, and Kick questionnaire    Fear of Current or Ex-Partner: No    Emotionally Abused: No    Physically Abused: No    Sexually Abused: No   Family History  Problem Relation Age of Onset   Hypertension Mother    Heart disease Mother    Hypertension Father    Diabetes Sister    Cancer Sister 92        Uterine Cancer had hysterectomy   Congestive Heart Failure Brother    Heart disease Brother    Heart disease Brother    Diabetes Brother    Seizures Brother    Hypertension Brother    Hyperlipidemia Brother    Seizures Son    Colon cancer Neg Hx    Colon polyps Neg Hx    Esophageal cancer Neg Hx    Stomach cancer Neg Hx    Rectal cancer Neg Hx     Objective: Office vital signs reviewed. BP 116/61   Pulse 76   Temp 98.5 F (36.9 C)   Ht 5\' 2"  (1.575 m)   Wt 131 lb (59.4 kg)   SpO2 97%   BMI 23.96 kg/m   Physical Examination:  General: Awake, alert, well nourished, No acute distress HEENT: Sclera white.  Moist mucous membranes Cardio: regular rate and rhythm, S1S2 heard, no murmurs appreciated Pulm: clear to auscultation bilaterally, no wheezes, rhonchi or rales; normal work of breathing on room air MSK: Ambulating independently with normal gait and station.  Mild joint effusion noted on the left but no erythema, warmth  Assessment/ Plan: 72 y.o. 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  Check renal function.  Future orders placed for physical in 6 months.  I renewed all of her medications  Asthma is chronic and stable.  Breo renewed.  May consider switching to [facility] if having more difficulty with getting dry powder down  Her left anterior knee pain has gotten quite a bit better and she is established with orthopedics if needed.  Has known meniscal cyst  Raliegh Ip, DO [facility] [phone]

## 2022-12-30 LAB — RENAL FUNCTION PANEL
Albumin: 4.2 g/dL (ref 3.8–4.8)
BUN/Creatinine Ratio: 17 (ref 12–28)
BUN: 16 mg/dL (ref 8–27)
CO2: 24 mmol/L (ref 20–29)
Calcium: 9.6 mg/dL (ref 8.7–10.3)
Chloride: 103 mmol/L (ref 96–106)
Creatinine, Ser: 0.96 mg/dL (ref 0.57–1.00)
Glucose: 113 mg/dL — ABNORMAL HIGH (ref 70–99)
Phosphorus: 2.9 mg/dL — ABNORMAL LOW (ref 3.0–4.3)
Potassium: 4.3 mmol/L (ref 3.5–5.2)
Sodium: 141 mmol/L (ref 134–144)
eGFR: 63 mL/min/{1.73_m2} (ref >59)

## 2023-02-08 ENCOUNTER — Other Ambulatory Visit (HOSPITAL_COMMUNITY): Payer: Self-pay | Admitting: Family Medicine

## 2023-02-08 DIAGNOSIS — Z1231 Encounter for screening mammogram for malignant neoplasm of breast: Secondary | ICD-10-CM

## 2023-02-24 ENCOUNTER — Ambulatory Visit (HOSPITAL_COMMUNITY)
Admission: RE | Admit: 2023-02-24 | Discharge: 2023-02-24 | Disposition: A | Discharge status: Home / Self Care | Payer: Medicare HMO | Source: Ambulatory Visit | Attending: Family Medicine | Admitting: Family Medicine

## 2023-02-24 DIAGNOSIS — Z1231 Encounter for screening mammogram for malignant neoplasm of breast: Secondary | ICD-10-CM | POA: Diagnosis not present

## 2023-03-23 DIAGNOSIS — H524 Presbyopia: Secondary | ICD-10-CM | POA: Diagnosis not present

## 2023-03-23 DIAGNOSIS — H43393 Other vitreous opacities, bilateral: Secondary | ICD-10-CM | POA: Diagnosis not present

## 2023-06-30 ENCOUNTER — Ambulatory Visit: Payer: Medicare HMO | Admitting: Family Medicine

## 2023-06-30 ENCOUNTER — Encounter: Payer: Self-pay | Admitting: Family Medicine

## 2023-06-30 VITALS — BP 117/64 | HR 74 | Temp 98.5°F | Ht 62.0 in | Wt 132.2 lb

## 2023-06-30 DIAGNOSIS — J4520 Mild intermittent asthma, uncomplicated: Secondary | ICD-10-CM | POA: Diagnosis not present

## 2023-06-30 DIAGNOSIS — N1831 Chronic kidney disease, stage 3a: Secondary | ICD-10-CM

## 2023-06-30 DIAGNOSIS — I129 Hypertensive chronic kidney disease with stage 1 through stage 4 chronic kidney disease, or unspecified chronic kidney disease: Secondary | ICD-10-CM

## 2023-06-30 DIAGNOSIS — Z780 Asymptomatic menopausal state: Secondary | ICD-10-CM

## 2023-06-30 DIAGNOSIS — Z0000 Encounter for general adult medical examination without abnormal findings: Secondary | ICD-10-CM

## 2023-06-30 DIAGNOSIS — Z0001 Encounter for general adult medical examination with abnormal findings: Secondary | ICD-10-CM | POA: Diagnosis not present

## 2023-06-30 DIAGNOSIS — Z13820 Encounter for screening for osteoporosis: Secondary | ICD-10-CM | POA: Diagnosis not present

## 2023-06-30 DIAGNOSIS — I10 Essential (primary) hypertension: Secondary | ICD-10-CM | POA: Diagnosis not present

## 2023-06-30 DIAGNOSIS — E7800 Pure hypercholesterolemia, unspecified: Secondary | ICD-10-CM

## 2023-06-30 LAB — LIPID PANEL

## 2023-06-30 MED ORDER — FLUTICASONE FUROATE-VILANTEROL 100-25 MCG/ACT IN AEPB: 1.0000 | INHALATION_SPRAY | Freq: Every day | RESPIRATORY_TRACT | 3 refills | Status: AC

## 2023-06-30 MED ORDER — AMLODIPINE BESYLATE 2.5 MG PO TABS: 2.5000 mg | ORAL_TABLET | Freq: Every morning | ORAL | 3 refills | Status: DC

## 2023-06-30 NOTE — Progress Notes (Signed)
 Kimberly Aguilar is a 73 y.o. female presents to office today for annual physical exam examination.    Concerns today include: 1.  Viral gastroenteritis?   She reports that she was extremely ill with nausea, vomiting and diarrhea that was nonbloody for several hours yesterday.  She reported excessive belching.  She took Imodium and that seemed to help.  She may have been exposed to something by her sister-in-law who also had some type of GI illness a couple of weeks prior.  She also admits that she had eaten an egg that may have been in the fridge rater too long.  She has not had any diarrheal episodes since yesterday morning.  She reports no episodes of vomiting.  She was able to tolerate chicken noodle soup last night and she is back to hydrating well.  No fevers, myalgia, abdominal pain  Occupation: Retired, Marital status: Single.  Resides with sisters, Substance use: None Health Maintenance Due  Topic Date Due   DEXA SCAN  11/09/2022   Refills needed today: All  Immunization History  Administered Date(s) Administered   Influenza,inj,Quad PF,6+ Mos 03/03/2020   Moderna Covid-19 Fall Seasonal Vaccine 21yrs & older 03/20/2023   Moderna Sars-Covid-2 Vaccination 06/12/2019, 07/11/2019, 03/21/2020   Td 09/25/2009   Past Medical History:  Diagnosis Date   Adenoma    PARATHYROID   Asthma    Colon polyp    Hypercalcemia    Hyperparathyroidism (HCC) 06/30/2013   Hypertension    Parathyroid adenoma 2015   Social History   Socioeconomic History   Marital status: Widowed    Spouse name: Not on file   Number of children: 2   Years of education: Not on file   Highest education level: Not on file  Occupational History   Not on file  Tobacco Use   Smoking status: Former    Current packs/day: 0.00    Types: Cigarettes    Quit date: 03/31/1973    Years since quitting: 50.2   Smokeless tobacco: Never  Vaping Use   Vaping status: Never Used  Substance and Sexual Activity   Alcohol  use: No   Drug use: No   Sexual activity: Not on file  Other Topics Concern   Not on file  Social History Narrative   Ms Shor has a daughter who resides in [place]   She had a son, who was disabled after he sustained TBI 2/2 meningitis, but he passed away 06-07-17.   Social Drivers of Corporate investment banker Strain: Low Risk  (06/30/2023)   Overall Financial Resource Strain (CARDIA)    Difficulty of Paying Living Expenses: Not very hard  Food Insecurity: No Food Insecurity (06/30/2023)   Hunger Vital Sign    Worried About Running Out of Food in the Last Year: Never true    Ran Out of Food in the Last Year: Never true  Transportation Needs: No Transportation Needs (06/30/2023)   PRAPARE - Administrator, Civil Service (Medical): No    Lack of Transportation (Non-Medical): No  Physical Activity: Insufficiently Active (06/30/2023)   Exercise Vital Sign    Days of Exercise per Week: 3 days    Minutes of Exercise per Session: 30 min  Stress: No Stress Concern Present (06/30/2023)   [employer] of Occupational Health - Occupational Stress Questionnaire    Feeling of Stress : Not at all  Social Connections: Moderately Isolated (06/30/2023)   Social Connection and Isolation Panel [NHANES]  Frequency of Communication with Friends and Family: More than three times a week    Frequency of Social Gatherings with Friends and Family: More than three times a week    Attends Religious Services: More than 4 times per year    Active Member of [employer] or Organizations: No    Attends Banker Meetings: Never    Marital Status: Widowed  Intimate Partner Violence: Not At Risk (06/30/2023)   Humiliation, Afraid, Rape, and Kick questionnaire    Fear of Current or Ex-Partner: No    Emotionally Abused: No    Physically Abused: No    Sexually Abused: No   Past Surgical History:  Procedure Laterality Date   COLONOSCOPY  2012   cyst removal from left breast      PARATHYROIDECTOMY Right 08/07/2013   Procedure: PARATHYROIDECTOMY;  Surgeon: Axel Filler, MD;  Location: [facility];  Service: General;  Laterality: Right;   Family History  Problem Relation Age of Onset   Hypertension Mother    Heart disease Mother    Hypertension Father    Diabetes Sister    Cancer Sister 45       Uterine Cancer had hysterectomy   Congestive Heart Failure Brother    Heart disease Brother    Heart disease Brother    Diabetes Brother    Seizures Brother    Hypertension Brother    Hyperlipidemia Brother    Seizures Son    Colon cancer Neg Hx    Colon polyps Neg Hx    Esophageal cancer Neg Hx    Stomach cancer Neg Hx    Rectal cancer Neg Hx     Current Outpatient Medications:    cholecalciferol (VITAMIN D3) 25 MCG (1000 UNIT) tablet, Take 1,000 Units by mouth daily., Disp: , Rfl:    amLODipine (NORVASC) 2.5 MG tablet, Take 1 tablet (2.5 mg total) by mouth every morning., Disp: 90 tablet, Rfl: 3   fluticasone furoate-vilanterol (BREO ELLIPTA) 100-25 MCG/ACT AEPB, Inhale 1 puff into the lungs daily., Disp: 180 each, Rfl: 3  No Known Allergies   ROS: Review of Systems Pertinent items noted in HPI and remainder of comprehensive ROS otherwise negative.    Physical exam BP 117/64   Pulse 74   Temp 98.5 F (36.9 C)   Ht 5\' 2"  (1.575 m)   Wt 132 lb 3.2 oz (60 kg)   SpO2 100%   BMI 24.18 kg/m  General appearance: alert, cooperative, appears stated age, and no distress Head: Normocephalic, without obvious abnormality, atraumatic Eyes: negative findings: lids and lashes normal, conjunctivae and sclerae normal, corneas clear, and pupils equal, round, reactive to light and accomodation Ears: normal TM's and external ear canals both ears Nose: no discharge Throat: lips, mucosa, and tongue normal; teeth and gums normal Neck: no adenopathy, supple, symmetrical, trachea midline, and thyroid not enlarged, symmetric, no tenderness/mass/nodules Back: symmetric, no  curvature. ROM normal. No CVA tenderness. Lungs: clear to auscultation bilaterally Heart: regular rate and rhythm, S1, S2 normal, no murmur, click, rub or gallop Abdomen: soft, non-tender; bowel sounds normal; no masses,  no organomegaly Extremities: extremities normal, atraumatic, no cyanosis or edema Pulses: 2+ and symmetric Skin: Skin color, texture, turgor normal. No rashes or lesions.  Several pigmented nevi and seborrheic keratosis noted along the bra line on the right Lymph nodes: Cervical, supraclavicular, and axillary nodes normal. Neurologic: Grossly normal      06/30/2023   11:18 AM 12/29/2022    3:36 PM 09/02/2022  2:53 PM  Depression screen PHQ 2/9  Decreased Interest 0 0 0  Down, Depressed, Hopeless 0 0 0  PHQ - 2 Score 0 0 0  Altered sleeping 0 0   Tired, decreased energy 0 0   Change in appetite 0 0   Feeling bad or failure about yourself  0 0   Trouble concentrating 0 0   Moving slowly or fidgety/restless 0 0   Suicidal thoughts 0 0   PHQ-9 Score 0 0   Difficult doing work/chores Not difficult at all Not difficult at all       06/30/2023   11:18 AM 12/29/2022    3:35 PM 06/10/2022   10:53 AM 11/28/2021   11:21 AM  GAD 7 : Generalized Anxiety Score  Nervous, Anxious, on Edge 0 0 0 0  Control/stop worrying 0 0 0 0  Worry too much - different things 0 0 0 0  Trouble relaxing 0 0 0 0  Restless 0 0 0 0  Easily annoyed or irritable 0 0 0 0  Afraid - awful might happen 0 0 0 0  Total GAD 7 Score 0 0 0 0  Anxiety Difficulty Not difficult at all Not difficult at all  Not difficult at all     Assessment/ Plan: Kimberly Aguilar here for annual physical exam.   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  She declined vaccination today given recent GI illness.  However she looks to be largely recovered from this and doing well.  Encouraged her to contact our office at any time to have vaccinations updated.  Will collect labs today.  Amlodipine renewed along with her Breo  Her asthma is chronic and stable and well-controlled  Counseled on healthy lifestyle choices, including diet (rich in fruits, vegetables and lean meats and low in salt and simple carbohydrates) and exercise (at least 30 minutes of moderate physical activity daily).  Patient to follow up 6 m for renal function  Kimberly Klemens M. Nadine Counts, DO

## 2023-06-30 NOTE — Patient Instructions (Signed)
 Seborrheic Keratosis A seborrheic keratosis is a common, noncancerous (benign) skin growth. These growths are velvety, waxy, or rough spots that appear on the skin. They are often tan, brown, or black. The skin growths can be flat or raised and may be scaly. What are the causes? The cause of this condition is not known. What increases the risk? You are more likely to develop this condition if you: Have a family history of seborrheic keratosis. Are 72 years old or older. Are pregnant. Have had estrogen replacement therapy. What are the signs or symptoms? Symptoms of this condition include growths on the face, chest, shoulders, back, or other areas. These growths: Are usually painless, but may become irritated and itchy. Can be tan, yellow, brown, black, or other colors. Are slightly raised or have a flat surface. Are sometimes rough or wart-like in texture. Are often velvety or waxy on the surface. Are round or oval-shaped. Often occur in groups, but may occur as a single growth. How is this diagnosed? This condition is diagnosed with a medical history and physical exam. A sample of the growth may be tested (skin biopsy). You may also need to see a skin specialist (dermatologist). How is this treated? Treatment is not usually needed for this condition unless the growths are irritated or bleed often. You may also choose to have the growths removed if you do not like their appearance. Growth removal may include a procedure in which: Liquid nitrogen is applied to "freeze" off the growth (cryosurgery). This is the most common procedure. The growth is burned off with electricity (electrocautery). The growth is removed by scraping (curettage). Follow these instructions at home: Watch your growth or growths for any changes. Do not scratch or pick at the growth or growths. This can cause them to become irritated or infected. Contact a health care provider if: You suddenly have many new  growths. Your growth bleeds, itches, or hurts. Your growth suddenly becomes larger or changes color. Summary A seborrheic keratosis is a common, noncancerous skin growth. Treatment is not usually needed for this condition unless the growths are irritated or bleed often. Watch your growth or growths for any changes. Contact a health care provider if you suddenly have many new growths or your growth suddenly becomes larger or changes color. This information is not intended to replace advice given to you by your health care provider. Make sure you discuss any questions you have with your health care provider. Document Revised: 06/20/2021 Document Reviewed: 06/20/2021 Elsevier Patient Education  2024 [employer].

## 2023-07-01 LAB — CMP14+EGFR
ALT: 10 IU/L (ref 0–32)
AST: 17 IU/L (ref 0–40)
Albumin: 4.3 g/dL (ref 3.8–4.8)
Alkaline Phosphatase: 56 IU/L (ref 44–121)
BUN/Creatinine Ratio: 14 (ref 12–28)
BUN: 16 mg/dL (ref 8–27)
Bilirubin Total: 0.4 mg/dL (ref 0.0–1.2)
CO2: 24 mmol/L (ref 20–29)
Calcium: 9.5 mg/dL (ref 8.7–10.3)
Chloride: 102 mmol/L (ref 96–106)
Creatinine, Ser: 1.14 mg/dL — ABNORMAL HIGH (ref 0.57–1.00)
Globulin, Total: 2.4 g/dL (ref 1.5–4.5)
Glucose: 86 mg/dL (ref 70–99)
Potassium: 4.3 mmol/L (ref 3.5–5.2)
Sodium: 140 mmol/L (ref 134–144)
Total Protein: 6.7 g/dL (ref 6.0–8.5)
eGFR: 51 mL/min/{1.73_m2} — ABNORMAL LOW (ref >59)

## 2023-07-01 LAB — CBC
Hematocrit: 40.6 % (ref 34.0–46.6)
Hemoglobin: 13.5 g/dL (ref 11.1–15.9)
MCH: 30.6 pg (ref 26.6–33.0)
MCHC: 33.3 g/dL (ref 31.5–35.7)
MCV: 92 fL (ref 79–97)
Platelets: 273 10*3/uL (ref 150–450)
RBC: 4.41 x10E6/uL (ref 3.77–5.28)
RDW: 12.2 % (ref 11.7–15.4)
WBC: 2.7 10*3/uL — ABNORMAL LOW (ref 3.4–10.8)

## 2023-07-01 LAB — LIPID PANEL
Cholesterol, Total: 225 mg/dL — ABNORMAL HIGH (ref 100–199)
HDL: 94 mg/dL (ref >39)
LDL CALC COMMENT:: 2.4 ratio (ref 0.0–4.4)
LDL Chol Calc (NIH): 122 mg/dL — ABNORMAL HIGH (ref 0–99)
Triglycerides: 52 mg/dL (ref 0–149)
VLDL Cholesterol Cal: 9 mg/dL (ref 5–40)

## 2023-07-01 LAB — VITAMIN D 25 HYDROXY (VIT D DEFICIENCY, FRACTURES): Vit D, 25-Hydroxy: 50.8 ng/mL (ref 30.0–100.0)

## 2023-07-02 ENCOUNTER — Encounter: Payer: Self-pay | Admitting: Family Medicine

## 2023-07-05 ENCOUNTER — Encounter: Payer: Medicare HMO | Admitting: Family Medicine

## 2023-07-06 ENCOUNTER — Other Ambulatory Visit: Payer: Self-pay | Admitting: Family Medicine

## 2023-07-06 DIAGNOSIS — D72819 Decreased white blood cell count, unspecified: Secondary | ICD-10-CM

## 2023-07-07 ENCOUNTER — Telehealth: Payer: Self-pay | Admitting: Family Medicine

## 2023-07-07 ENCOUNTER — Other Ambulatory Visit

## 2023-07-07 NOTE — Telephone Encounter (Signed)
 Copied from CRM [id number]. Topic: General - Other >> Jul 07, 2023 10:28 AM Emylou G wrote: Reason for CRM: pls call patient - had to miss her appt 11:30 AM - [facility] DEXA [facility] DEXA Because she took meds by mistake - she should of waited after imaging.. number on file is good to reschedule her appt

## 2023-07-07 NOTE — Telephone Encounter (Unsigned)
 Copied from CRM [id number]. Topic: Appointments - Scheduling Inquiry for Clinic >> Jul 07, 2023 10:27 AM Fuller Mandril wrote: Reason for CRM: Patient called stated she has bone density appt and was told not to take medicine but took the medicine and was calling to see if she needs to reschedule. Attempted to connect with imaging call disconnected. Attempted to call patient back. Received vm - vm box full unable to leave message. Thank You

## 2023-07-08 NOTE — Telephone Encounter (Signed)
 Called and schedule appt for 07/14/2023

## 2023-07-12 ENCOUNTER — Inpatient Hospital Stay

## 2023-07-12 ENCOUNTER — Encounter: Payer: Self-pay | Admitting: Oncology

## 2023-07-12 ENCOUNTER — Inpatient Hospital Stay: Attending: Oncology | Admitting: Oncology

## 2023-07-12 VITALS — BP 119/63 | HR 65 | Temp 99.0°F | Resp 16 | Wt 130.5 lb

## 2023-07-12 DIAGNOSIS — I10 Essential (primary) hypertension: Secondary | ICD-10-CM | POA: Insufficient documentation

## 2023-07-12 DIAGNOSIS — E611 Iron deficiency: Secondary | ICD-10-CM | POA: Insufficient documentation

## 2023-07-12 DIAGNOSIS — Z79899 Other long term (current) drug therapy: Secondary | ICD-10-CM | POA: Insufficient documentation

## 2023-07-12 DIAGNOSIS — D72819 Decreased white blood cell count, unspecified: Secondary | ICD-10-CM

## 2023-07-12 LAB — FERRITIN: Ferritin: 17 ng/mL (ref 11–307)

## 2023-07-12 LAB — CBC WITH DIFFERENTIAL/PLATELET
Abs Immature Granulocytes: 0.01 10*3/uL (ref 0.00–0.07)
Basophils Absolute: 0 10*3/uL (ref 0.0–0.1)
Basophils Relative: 1 %
Eosinophils Absolute: 0.1 10*3/uL (ref 0.0–0.5)
Eosinophils Relative: 3 %
HCT: 42.5 % (ref 36.0–46.0)
Hemoglobin: 13.5 g/dL (ref 12.0–15.0)
Immature Granulocytes: 0 %
Lymphocytes Relative: 58 %
Lymphs Abs: 2.1 10*3/uL (ref 0.7–4.0)
MCH: 30.1 pg (ref 26.0–34.0)
MCHC: 31.8 g/dL (ref 30.0–36.0)
MCV: 94.7 fL (ref 80.0–100.0)
Monocytes Absolute: 0.3 10*3/uL (ref 0.1–1.0)
Monocytes Relative: 8 %
Neutro Abs: 1.1 10*3/uL — ABNORMAL LOW (ref 1.7–7.7)
Neutrophils Relative %: 30 %
Platelets: 326 10*3/uL (ref 150–400)
RBC: 4.49 MIL/uL (ref 3.87–5.11)
RDW: 12.5 % (ref 11.5–15.5)
WBC: 3.6 10*3/uL — ABNORMAL LOW (ref 4.0–10.5)
nRBC: 0 % (ref 0.0–0.2)

## 2023-07-12 LAB — COMPREHENSIVE METABOLIC PANEL
ALT: 14 U/L (ref 0–44)
AST: 18 U/L (ref 15–41)
Albumin: 4 g/dL (ref 3.5–5.0)
Alkaline Phosphatase: 55 U/L (ref 38–126)
Anion gap: 9 (ref 5–15)
BUN: 17 mg/dL (ref 8–23)
CO2: 27 mmol/L (ref 22–32)
Calcium: 10 mg/dL (ref 8.9–10.3)
Chloride: 103 mmol/L (ref 98–111)
Creatinine, Ser: 1.01 mg/dL — ABNORMAL HIGH (ref 0.44–1.00)
GFR, Estimated: 59 mL/min — ABNORMAL LOW (ref >60)
Glucose, Bld: 101 mg/dL — ABNORMAL HIGH (ref 70–99)
Potassium: 4.3 mmol/L (ref 3.5–5.1)
Sodium: 139 mmol/L (ref 135–145)
Total Bilirubin: 0.4 mg/dL (ref 0.0–1.2)
Total Protein: 7.2 g/dL (ref 6.5–8.1)

## 2023-07-12 LAB — IRON AND TIBC
Iron: 84 ug/dL (ref 28–170)
Saturation Ratios: 22 % (ref 10.4–31.8)
TIBC: 387 ug/dL (ref 250–450)
UIBC: 303 ug/dL

## 2023-07-12 LAB — URIC ACID: Uric Acid, Serum: 4.9 mg/dL (ref 2.5–7.1)

## 2023-07-12 LAB — FOLATE: Folate: 16 ng/mL (ref >5.9)

## 2023-07-12 LAB — VITAMIN B12: Vitamin B-12: 371 pg/mL (ref 180–914)

## 2023-07-12 LAB — LACTATE DEHYDROGENASE: LDH: 156 U/L (ref 98–192)

## 2023-07-12 NOTE — Progress Notes (Signed)
 [facility] at [facility]  HEMATOLOGY NEW VISIT  Raliegh Ip, [place]  REASON FOR REFERRAL: Leukopenia   HISTORY OF PRESENT ILLNESS: Bricia Taher 73 y.o. female referred for leukopenia.  She has a past medical history of hypertension.  Patient reported that she was told that her white blood cell count was low 1 year ago.   The patient reports a recent episode of acute gastroenteritis, characterized by sudden onset nausea, vomiting, and diarrhea. The episode resolved within a day after taking Imodium.  This was right before the blood work. She denies fever, chills, recent weight loss, loss of appetite, or fatigue.  She is overall feeling very well.  Has no complaints today.  She denies smoking, alcohol use.  No history of cancer in the family.  I have reviewed the past medical history, past surgical history, social history and family history with the patient   ALLERGIES:  has no known allergies.  MEDICATIONS:  Current Outpatient Medications  Medication Sig Dispense Refill   amLODipine (NORVASC) 2.5 MG tablet Take 1 tablet (2.5 mg total) by mouth every morning. 90 tablet 3   cholecalciferol (VITAMIN D3) 25 MCG (1000 UNIT) tablet Take 1,000 Units by mouth daily.     fluticasone furoate-vilanterol (BREO ELLIPTA) 100-25 MCG/ACT AEPB Inhale 1 puff into the lungs daily. 180 each 3   No current facility-administered medications for this visit.     REVIEW OF SYSTEMS:   Constitutional: Denies fevers, chills or night sweats Eyes: Denies blurriness of vision Ears, nose, mouth, throat, and face: Denies mucositis or sore throat Respiratory: Denies cough, dyspnea or wheezes Cardiovascular: Denies palpitation, chest discomfort or lower extremity swelling Gastrointestinal:  Denies nausea, heartburn or change in bowel habits Skin: Denies abnormal skin rashes Lymphatics: Denies new lymphadenopathy or easy bruising Neurological:Denies numbness, tingling or new  weaknesses Behavioral/Psych: Mood is stable, no new changes  All other systems were reviewed with the patient and are negative.  PHYSICAL EXAMINATION:   Vitals:   07/12/23 1234  BP: 119/63  Pulse: 65  Resp: 16  Temp: 99 F (37.2 C)  SpO2: 100%    GENERAL:alert, no distress and comfortable LYMPH:  no palpable lymphadenopathy in the cervical, axillary or inguinal LUNGS: clear to auscultation and percussion with normal breathing effort HEART: regular rate & rhythm and no murmurs and no lower extremity edema ABDOMEN:abdomen soft, non-tender and normal bowel sounds Musculoskeletal:no cyanosis of digits and no clubbing  NEURO: alert & oriented x 3 with fluent speech  LABORATORY DATA:  I have reviewed the data as listed  Lab Results  Component Value Date   WBC 3.6 (L) 07/12/2023   NEUTROABS 1.1 (L) 07/12/2023   HGB 13.5 07/12/2023   HCT 42.5 07/12/2023   MCV 94.7 07/12/2023   PLT 326 07/12/2023      Chemistry      Component Value Date/Time   NA 139 07/12/2023 1318   NA 140 06/30/2023 1155   K 4.3 07/12/2023 1318   CL 103 07/12/2023 1318   CO2 27 07/12/2023 1318   BUN 17 07/12/2023 1318   BUN 16 06/30/2023 1155   CREATININE 1.01 (H) 07/12/2023 1318      Component Value Date/Time   CALCIUM 10.0 07/12/2023 1318   ALKPHOS 55 07/12/2023 1318   AST 18 07/12/2023 1318   ALT 14 07/12/2023 1318   BILITOT 0.4 07/12/2023 1318   BILITOT 0.4 06/30/2023 1155      ASSESSMENT & PLAN:  Patient is a 73 year old  female referred for leukopenia   Leukopenia 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  No B symptoms at this time. -Will send workup for nutritional deficiency including vitamin B12, folate and iron levels.  If everything is normal, we will send for copper level and TSH on next visit. -Will repeat CBC today. -If all the workup is normal, will also work her up for Duffy null antigen and will send for flow cytometry.  Return  to clinic in 2 weeks to discuss results   Orders Placed This Encounter  Procedures   Ferritin    Standing Status:   Future    Number of Occurrences:   1    Expected Date:   07/12/2023    Expiration Date:   07/11/2024   Folate    Standing Status:   Future    Number of Occurrences:   1    Expected Date:   07/12/2023    Expiration Date:   07/11/2024   Vitamin B12    Standing Status:   Future    Number of Occurrences:   1    Expected Date:   07/12/2023    Expiration Date:   07/11/2024   Comprehensive metabolic panel    Standing Status:   Future    Number of Occurrences:   1    Expected Date:   07/12/2023    Expiration Date:   07/11/2024   CBC with Differential/Platelet    Standing Status:   Future    Number of Occurrences:   1    Expected Date:   07/12/2023    Expiration Date:   07/11/2024   Iron and TIBC    Standing Status:   Future    Number of Occurrences:   1    Expected Date:   07/12/2023    Expiration Date:   07/11/2024   Lactate dehydrogenase    Standing Status:   Future    Number of Occurrences:   1    Expected Date:   07/12/2023    Expiration Date:   07/11/2024   Uric acid    Standing Status:   Future    Number of Occurrences:   1    Expected Date:   07/12/2023    Expiration Date:   07/11/2024    The total time spent in the appointment was 40 minutes encounter with patients including review of chart and various tests results, discussions about plan of care and coordination of care plan   All questions were answered. The patient knows to call the clinic with any problems, questions or concerns. No barriers to learning was detected.   Cindie Crumbly, MD 3/24/20256:22 PM

## 2023-07-12 NOTE — Patient Instructions (Signed)
 VISIT SUMMARY:  You came in today to evaluate your persistently low white blood cell count, which was first noticed in February of last year. You also mentioned a recent episode of acute gastroenteritis that resolved quickly.  YOUR PLAN:  -LEUKOPENIA: Leukopenia means having a lower than normal white blood cell count, which can make it harder for your body to fight infections. This may be due to a recent infection or nutritional deficiencies. We will check your vitamin B12 and iron levels and repeat your complete blood count (CBC).  INSTRUCTIONS:  Please complete the blood work for vitamin B12 and iron levels as soon as possible. We will repeat your CBC and schedule a follow-up appointment in two weeks to discuss the results.

## 2023-07-12 NOTE — Assessment & Plan Note (Addendum)
 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  No B symptoms at this time. -Will send workup for nutritional deficiency including vitamin B12, folate and iron levels.  If everything is normal, we will send for copper level and TSH on next visit. -Will repeat CBC today. -If all the workup is normal, will also work her up for Duffy null antigen and will send for flow cytometry.  Return to clinic in 2 weeks to discuss results

## 2023-07-14 ENCOUNTER — Ambulatory Visit (INDEPENDENT_AMBULATORY_CARE_PROVIDER_SITE_OTHER)

## 2023-07-14 ENCOUNTER — Other Ambulatory Visit: Payer: Self-pay | Admitting: Family Medicine

## 2023-07-14 DIAGNOSIS — Z780 Asymptomatic menopausal state: Secondary | ICD-10-CM

## 2023-07-15 DIAGNOSIS — Z780 Asymptomatic menopausal state: Secondary | ICD-10-CM | POA: Diagnosis not present

## 2023-07-16 ENCOUNTER — Encounter: Payer: Self-pay | Admitting: Family Medicine

## 2023-07-26 ENCOUNTER — Inpatient Hospital Stay: Attending: Oncology | Admitting: Oncology

## 2023-07-26 VITALS — BP 124/71 | HR 64 | Temp 97.7°F | Resp 16 | Wt 131.4 lb

## 2023-07-26 DIAGNOSIS — D72819 Decreased white blood cell count, unspecified: Secondary | ICD-10-CM | POA: Diagnosis not present

## 2023-07-26 DIAGNOSIS — K5900 Constipation, unspecified: Secondary | ICD-10-CM | POA: Insufficient documentation

## 2023-07-26 DIAGNOSIS — E611 Iron deficiency: Secondary | ICD-10-CM | POA: Insufficient documentation

## 2023-07-26 DIAGNOSIS — E538 Deficiency of other specified B group vitamins: Secondary | ICD-10-CM | POA: Insufficient documentation

## 2023-07-26 DIAGNOSIS — D709 Neutropenia, unspecified: Secondary | ICD-10-CM | POA: Diagnosis not present

## 2023-07-26 DIAGNOSIS — Z79899 Other long term (current) drug therapy: Secondary | ICD-10-CM | POA: Diagnosis not present

## 2023-07-26 MED ORDER — FERROUS SULFATE 325 (65 FE) MG PO TBEC: 325.0000 mg | DELAYED_RELEASE_TABLET | ORAL | 3 refills | Status: AC

## 2023-07-26 MED ORDER — VITAMIN B-12 1000 MCG PO TABS: 1000.0000 ug | ORAL_TABLET | Freq: Every day | ORAL | 0 refills | Status: AC

## 2023-07-26 NOTE — Progress Notes (Unsigned)
 [facility]  HEMATOLOGY FOLLOW-UP VISIT  Delynn Flavin M, DO  REASON FOR FOLLOW-UP: Leukopenia  ASSESSMENT & PLAN:  Patient is a 73 y.o. 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   No B symptoms at this time. Mild Iron and Vitamin B12 deficiency WBC slightly improved from prior   -Will order flow cytometry and duffy null antigen testing -Continue to monitor counts  Return to clinic in 3 months  Iron deficiency Mild iron deficiency with ferritin at 12.  Iron supplementation recommended to increase ferritin levels.   - Start ferrous sulfate 324mg  every other day to avoid constipation.Use Miralax for constipation - Order repeat iron labs in three months.  Vitamin B12 deficiency Mild vitamin B12 deficiency. Supplementation recommended.  - Start vitamin B12 supplementation daily. Prescription sent - Order repeat B12 level in three months.   Orders Placed This Encounter  Procedures   CBC with Differential/Platelet    Standing Status:   Future    Expected Date:   07/26/2023    Expiration Date:   07/25/2024   Flow Cytometry, Peripheral Blood (Oncology)    Standing Status:   Future    Expected Date:   07/26/2023    Expiration Date:   07/25/2024   Miscellaneous [facility] test (send-out)    Standing Status:   Future    Expected Date:   07/26/2023    Expiration Date:   07/25/2024    Test name / description::   Red Blood Cell (RBC) Antigen Typing: Fya/Fyb   Ferritin    Standing Status:   Future    Expected Date:   10/25/2023    Expiration Date:   07/25/2024   Folate    Standing Status:   Future    Expected Date:   10/25/2023    Expiration Date:   07/25/2024   Vitamin B12    Standing Status:   Future    Expected Date:   10/25/2023    Expiration Date:   07/25/2024   CBC with Differential/Platelet    Standing Status:   Future    Expected Date:   10/25/2023    Expiration Date:    07/25/2024   Iron and TIBC    Standing Status:   Future    Expected Date:   10/25/2023    Expiration Date:   07/25/2024    The total time spent in the appointment was 20 minutes encounter with patients including review of chart and various tests results, discussions about plan of care and coordination of care plan   All questions were answered. The patient knows to call the clinic with any problems, questions or concerns. No barriers to learning was detected.  Cindie Crumbly, MD 4/8/202510:11 AM   SUMMARY OF HEMATOLOGIC HISTORY: Chronic leukopenia - Nutritional studies: WNL   INTERVAL HISTORY: Kimberly Aguilar 73 y.o. female following for leukopenia. She reports feeling 'all right' despite increased activity due to caring for her 60 year old grandson for the past three weeks. She denies fevers, chills, night sweats, and weight loss. She has a skin lesion on her leg that is itchy and red, which she believes to be an insect bite. The lesion has been present for about a week and has started to ooze. She reports similar lesions in the past, particularly in the spring after walking in the grass. She denies any other complaints.  I have reviewed the past medical history, past surgical history, social history and  family history with the patient   ALLERGIES:  has no known allergies.  MEDICATIONS:  Current Outpatient Medications  Medication Sig Dispense Refill   amLODipine (NORVASC) 2.5 MG tablet Take 1 tablet (2.5 mg total) by mouth every morning. 90 tablet 3   cholecalciferol (VITAMIN D3) 25 MCG (1000 UNIT) tablet Take 1,000 Units by mouth daily.     cyanocobalamin (VITAMIN B12) 1000 MCG tablet Take 1 tablet (1,000 mcg total) by mouth daily. 90 tablet 0   ferrous sulfate 325 (65 FE) MG EC tablet Take 1 tablet (325 mg total) by mouth every other day. 45 tablet 3   fluticasone furoate-vilanterol (BREO ELLIPTA) 100-25 MCG/ACT AEPB Inhale 1 puff into the lungs daily. 180 each 3   No current  facility-administered medications for this visit.     REVIEW OF SYSTEMS:   Constitutional: Denies fevers, chills or night sweats Eyes: Denies blurriness of vision Ears, nose, mouth, throat, and face: Denies mucositis or sore throat Respiratory: Denies cough, dyspnea or wheezes Cardiovascular: Denies palpitation, chest discomfort or lower extremity swelling Gastrointestinal:  Denies nausea, heartburn or change in bowel habits Skin: Denies abnormal skin rashes Lymphatics: Denies new lymphadenopathy or easy bruising Neurological:Denies numbness, tingling or new weaknesses Behavioral/Psych: Mood is stable, no new changes  All other systems were reviewed with the patient and are negative.  PHYSICAL EXAMINATION:   Vitals:   07/26/23 1317 07/26/23 1322  BP: (!) 145/71 124/71  Pulse: 64   Resp: 16   Temp: 97.7 F (36.5 C)   SpO2: 98%     GENERAL:alert, no distress and comfortable SKIN: skin color, texture, turgor are normal, no rashes.  Erythematous round with central dip just above right ankle.   LYMPH:  no palpable lymphadenopathy in the cervical, axillary or inguinal LUNGS: clear to auscultation and percussion with normal breathing effort HEART: regular rate & rhythm and no murmurs and no lower extremity edema ABDOMEN:abdomen soft, non-tender and normal bowel sounds Musculoskeletal:no cyanosis of digits and no clubbing  NEURO: alert & oriented x 3 with fluent speech  LABORATORY DATA:  I have reviewed the data as listed  Lab Results  Component Value Date   WBC 3.6 (L) 07/12/2023   NEUTROABS 1.1 (L) 07/12/2023   HGB 13.5 07/12/2023   HCT 42.5 07/12/2023   MCV 94.7 07/12/2023   PLT 326 07/12/2023       Chemistry      Component Value Date/Time   NA 139 07/12/2023 1318   NA 140 06/30/2023 1155   K 4.3 07/12/2023 1318   CL 103 07/12/2023 1318   CO2 27 07/12/2023 1318   BUN 17 07/12/2023 1318   BUN 16 06/30/2023 1155   CREATININE 1.01 (H) 07/12/2023 1318       Component Value Date/Time   CALCIUM 10.0 07/12/2023 1318   ALKPHOS 55 07/12/2023 1318   AST 18 07/12/2023 1318   ALT 14 07/12/2023 1318   BILITOT 0.4 07/12/2023 1318   BILITOT 0.4 06/30/2023 1155      Latest Reference Range & Units 07/12/23 13:17 07/12/23 13:18  Iron 28 - 170 ug/dL 84   UIBC ug/dL 540   TIBC 981 - 191 ug/dL 478   Saturation Ratios 10.4 - 31.8 % 22   Ferritin 11 - 307 ng/mL 17   Folate >5.9 ng/mL  16.0  Vitamin B12 180 - 914 pg/mL 371     Latest Reference Range & Units 07/12/23 13:18  LDH 98 - 192 U/L 156

## 2023-07-26 NOTE — Patient Instructions (Signed)
 VISIT SUMMARY:  You came in today for a routine follow-up. You mentioned feeling 'all right' despite increased activity from caring for your grandson. You have a skin lesion on your leg that is itchy and red, which you believe is an insect bite. You denied any other complaints such as fevers, chills, night sweats, or weight loss.  YOUR PLAN:  -LEUKOPENIA: Leukopenia means having a low white blood cell count, which can make it harder for your body to fight infections. Your white blood cell count is slightly above the normal threshold at 3.6. We do not suspect any infection or leukemia at this time. We will conduct further tests, including a Duffy antigen test and a leukemia test, and monitor your white blood cell count again in three months.  -IRON DEFICIENCY: Iron deficiency means your body has lower than normal levels of iron, which is important for making red blood cells. Your ferritin level is at 12, indicating mild iron deficiency. We recommend taking iron pills every other day to avoid constipation. We will repeat your iron labs in three months to check your progress.  -VITAMIN B12 DEFICIENCY: Vitamin B12 deficiency means your body has lower than normal levels of vitamin B12, which is important for nerve function and making red blood cells. We recommend taking vitamin B12 pills. We will repeat your B12 level in three months to check your progress.   INSTRUCTIONS:  Please schedule a follow-up appointment in three months. At that time, we will reassess your blood work and response to the supplements. Repeat blood work will be done in three months to check your iron, B12, and white blood cell count.

## 2023-07-27 DIAGNOSIS — E538 Deficiency of other specified B group vitamins: Secondary | ICD-10-CM | POA: Insufficient documentation

## 2023-07-27 DIAGNOSIS — E611 Iron deficiency: Secondary | ICD-10-CM | POA: Insufficient documentation

## 2023-07-27 NOTE — Assessment & Plan Note (Signed)
 Mild iron deficiency with ferritin at 12.  Iron supplementation recommended to increase ferritin levels.   - Start ferrous sulfate 324mg  every other day to avoid constipation.Use Miralax for constipation - Order repeat iron labs in three months.

## 2023-07-27 NOTE — Assessment & Plan Note (Signed)
 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   No B symptoms at this time. Mild Iron and Vitamin B12 deficiency WBC slightly improved from prior   -Will order flow cytometry and duffy null antigen testing -Continue to monitor counts  Return to clinic in 3 months

## 2023-07-27 NOTE — Assessment & Plan Note (Signed)
 Mild vitamin B12 deficiency. Supplementation recommended.  - Start vitamin B12 supplementation daily. Prescription sent - Order repeat B12 level in three months.

## 2023-09-02 ENCOUNTER — Ambulatory Visit: Payer: Medicare HMO

## 2023-09-02 VITALS — BP 124/71 | HR 64 | Ht 62.0 in | Wt 131.0 lb

## 2023-09-02 DIAGNOSIS — Z0000 Encounter for general adult medical examination without abnormal findings: Secondary | ICD-10-CM

## 2023-09-02 NOTE — Patient Instructions (Signed)
 Ms. Kimberly Aguilar , Thank you for taking time out of your busy schedule to complete your Annual Wellness Visit with me. I enjoyed our conversation and look forward to speaking with you again next year. I, as well as your care team,  appreciate your ongoing commitment to your health goals. Please review the following plan we discussed and let me know if I can assist you in the future. Your Game plan/ To Do List   Follow up Visits: Next Medicare AWV with our clinical staff: Thurdays, 09/04/24 at 12:30 p.m.   Next Office Visit with your provider: 12/28/23 at 1:00 p.m.  Clinician Recommendations:  Aim for 30 minutes of exercise or brisk walking, 6-8 glasses of water, and 5 servings of fruits and vegetables each day. N/a      This is a list of the screening recommended for you and due dates:  Health Maintenance  Topic Date Due   Zoster (Shingles) Vaccine (1 of 2) 09/30/2023*   DTaP/Tdap/Td vaccine (2 - Tdap) 06/29/2024*   Pneumonia Vaccine (1 of 2 - PCV) 06/29/2024*   Hepatitis C Screening  06/29/2024*   COVID-19 Vaccine (5 - Moderna risk 2024-25 season) 09/17/2023   Flu Shot  11/19/2023   Mammogram  02/24/2024   Medicare Annual Wellness Visit  09/01/2024   DEXA scan (bone density measurement)  07/14/2025   Colon Cancer Screening  11/13/2028   HPV Vaccine  Aged Out   Meningitis B Vaccine  Aged Out  *Topic was postponed. The date shown is not the original due date.    Advanced directives: Yes. Request for copies   [x]  Makes sure you receive the medical care that is consistent with your values, goals, and preferences  [x]  It provides guidance to your family and loved ones and reduces their decisional burden about whether or not they are making the right decisions based on your wishes.  Follow the link provided in your after visit summary or read over the paperwork we have mailed to you to help you started getting your Advance Directives in place. If you need assistance in completing these, please  reach out to us  so that we can help you!  See attachments for Preventive Care and Fall Prevention Tips.

## 2023-09-02 NOTE — Progress Notes (Signed)
 Subjective:   Kimberly Aguilar is a 73 y.o. who presents for a Medicare Wellness preventive visit.  As a reminder, Annual Wellness Visits don't include a physical exam, and some assessments may be limited, especially if this visit is performed virtually. We may recommend an in-person visit if needed.  Visit Complete: Virtual I connected with  Kimberly Aguilar on 09/02/23 by a audio enabled telemedicine application and verified that I am speaking with the correct person using two identifiers.  Patient Location: Home  Provider Location: Home Office  I discussed the limitations of evaluation and management by telemedicine. The patient expressed understanding and agreed to proceed.  Vital Signs: Because this visit was a virtual/telehealth visit, some criteria may be missing or patient reported. Any vitals not documented were not able to be obtained and vitals that have been documented are patient reported.  VideoDeclined- This patient declined Librarian, academic. Therefore the visit was completed with audio only.  Persons Participating in Visit: Patient.  AWV Questionnaire: No: Patient Medicare AWV questionnaire was not completed prior to this visit.  Cardiac Risk Factors include: advanced age (>48men, >38 women)     Objective:     Today's Vitals   09/02/23 1429  BP: 124/71  Pulse: 64  Weight: 131 lb (59.4 kg)  Height: 5\' 2"  (1.575 m)   Body mass index is 23.96 kg/m.     09/02/2023    2:34 PM 07/26/2023    1:18 PM 07/12/2023   12:37 PM 09/02/2022    2:54 PM 01/01/2022    3:24 PM 11/05/2020    9:07 AM 02/15/2018    3:29 PM  Advanced Directives  Does Patient Have a Medical Advance Directive? Yes No No No No No No  Type of Advance Directive Healthcare Power of Attorney        Would patient like information on creating a medical advance directive?  No - Patient declined No - Patient declined No - Patient declined No - Patient declined No - Patient declined  Yes (MAU/Ambulatory/Procedural Areas - Information given)    Current Medications (verified) Outpatient Encounter Medications as of 09/02/2023  Medication Sig   amLODipine  (NORVASC ) 2.5 MG tablet Take 1 tablet (2.5 mg total) by mouth every morning.   cholecalciferol (VITAMIN D3) 25 MCG (1000 UNIT) tablet Take 1,000 Units by mouth daily.   cyanocobalamin  (VITAMIN B12) 1000 MCG tablet Take 1 tablet (1,000 mcg total) by mouth daily.   ferrous sulfate  325 (65 FE) MG EC tablet Take 1 tablet (325 mg total) by mouth every other day.   fluticasone  furoate-vilanterol (BREO ELLIPTA ) 100-25 MCG/ACT AEPB Inhale 1 puff into the lungs daily.   No facility-administered encounter medications on file as of 09/02/2023.    Allergies (verified) Patient has no known allergies.   History: Past Medical History:  Diagnosis Date   Adenoma    PARATHYROID    Asthma    Colon polyp    Hypercalcemia    Hyperparathyroidism (HCC) 06/30/2013   Hypertension    Parathyroid  adenoma 2015   Past Surgical History:  Procedure Laterality Date   COLONOSCOPY  2012   cyst removal from left breast     PARATHYROIDECTOMY Right 08/07/2013   Procedure: PARATHYROIDECTOMY;  Surgeon: Shela Derby, MD;  Location: [facility];  Service: General;  Laterality: Right;   Family History  Problem Relation Age of Onset   Hypertension Mother    Heart disease Mother    Hypertension Father    Diabetes Sister  Cancer Sister 86       Uterine Cancer had hysterectomy   Congestive Heart Failure Brother    Heart disease Brother    Heart disease Brother    Diabetes Brother    Seizures Brother    Hypertension Brother    Hyperlipidemia Brother    Seizures Son    Colon cancer Neg Hx    Colon polyps Neg Hx    Esophageal cancer Neg Hx    Stomach cancer Neg Hx    Rectal cancer Neg Hx    Social History   Socioeconomic History   Marital status: Widowed    Spouse name: Not on file   Number of children: 2   Years of education: Not on  file   Highest education level: Not on file  Occupational History   Not on file  Tobacco Use   Smoking status: Former    Current packs/day: 0.00    Types: Cigarettes    Quit date: 03/31/1973    Years since quitting: 50.4   Smokeless tobacco: Never  Vaping Use   Vaping status: Never Used  Substance and Sexual Activity   Alcohol use: No   Drug use: No   Sexual activity: Not on file  Other Topics Concern   Not on file  Social History Narrative   Kimberly Aguilar has a daughter who resides in [place]   She had a son, who was disabled after he sustained TBI 2/2 meningitis, but he passed away 2017-06-30.   Social Drivers of Corporate investment banker Strain: Low Risk  (09/02/2023)   Overall Financial Resource Strain (CARDIA)    Difficulty of Paying Living Expenses: Not hard at all  Food Insecurity: No Food Insecurity (09/02/2023)   Hunger Vital Sign    Worried About Running Out of Food in the Last Year: Never true    Ran Out of Food in the Last Year: Never true  Transportation Needs: No Transportation Needs (09/02/2023)   PRAPARE - Administrator, Civil Service (Medical): No    Lack of Transportation (Non-Medical): No  Physical Activity: Insufficiently Active (09/02/2023)   Exercise Vital Sign    Days of Exercise per Week: 3 days    Minutes of Exercise per Session: 30 min  Stress: No Stress Concern Present (09/02/2023)   [employer] of Occupational Health - Occupational Stress Questionnaire    Feeling of Stress : Not at all  Social Connections: Moderately Isolated (09/02/2023)   Social Connection and Isolation Panel [NHANES]    Frequency of Communication with Friends and Family: More than three times a week    Frequency of Social Gatherings with Friends and Family: More than three times a week    Attends Religious Services: Never    Database administrator or Organizations: Yes    Attends Banker Meetings: Never    Marital Status: Widowed    Tobacco  Counseling Counseling given: Yes    Clinical Intake:  Pre-visit preparation completed: Yes  Pain : No/denies pain     BMI - recorded: 23.96 Nutritional Status: BMI of 19-24  Normal Nutritional Risks: None Diabetes: No  No results found for: "HGBA1C"   How often do you need to have someone help you when you read instructions, pamphlets, or other written materials from your doctor or pharmacy?: 1 - Never  Interpreter Needed?: No      Activities of Daily Living     09/02/2023    2:32 PM  In your present state of health, do you have any difficulty performing the following activities:  Hearing? 0  Vision? 0  Difficulty concentrating or making decisions? 0  Walking or climbing stairs? 0  Dressing or bathing? 0  Doing errands, shopping? 0  Preparing Food and eating ? N  Using the Toilet? N  In the past six months, have you accidently leaked urine? N  Do you have problems with loss of bowel control? N  Managing your Medications? N  Managing your Finances? N  Housekeeping or managing your Housekeeping? N    Patient Care Team: Eliodoro Guerin, DO as PCP - General (Family Medicine) Booker Buys, MD as Referring Physician (Internal Medicine) Baldemar Bond, MD (Ophthalmology)  Indicate any recent Medical Services you may have received from other than [facility] providers in the past year (date may be approximate).     Assessment:    This is a routine wellness examination for Kimberly Aguilar.  Hearing/Vision screen Hearing Screening - Comments:: Pt denies hearing dif Vision Screening - Comments:: Pt wears reading glass/goes to Dr. Nolon Baxter in [place], [place]   Goals Addressed               This Visit's Progress     Cut out extra servings (pt-stated)   On track     Reduce extra snacking at night after supper.       Exercise 3x per week (30 min per time)   On track     Patient Stated        Would like to get back to sewing again/go to the beach/play golf again        Depression Screen     09/02/2023    2:38 PM 06/30/2023   11:18 AM 12/29/2022    3:36 PM 09/02/2022    2:53 PM 06/10/2022   10:53 AM 01/01/2022    3:35 PM 11/28/2021   11:21 AM  PHQ 2/9 Scores  PHQ - 2 Score 0 0 0 0 0 0 0  PHQ- 9 Score 0 0 0  0      Fall Risk     09/02/2023    2:32 PM 06/30/2023   11:18 AM 09/02/2022    2:52 PM 06/10/2022   10:57 AM 06/10/2022   10:53 AM  Fall Risk   Falls in the past year? 0 0 0 1 0  Number falls in past yr: 0 0 0 0 0  Injury with Fall? 0 0 0 1 0  Risk for fall due to : No Fall Risks No Fall Risks No Fall Risks No Fall Risks No Fall Risks  Follow up Falls prevention discussed;Falls evaluation completed Falls evaluation completed Falls prevention discussed Education provided Education provided    MEDICARE RISK AT HOME:  Medicare Risk at Home Any stairs in or around the home?: No If so, are there any without handrails?: No Home free of loose throw rugs in walkways, pet beds, electrical cords, etc?: Yes Adequate lighting in your home to reduce risk of falls?: Yes Life alert?: No Use of a cane, walker or w/c?: No Grab bars in the bathroom?: No Shower chair or bench in shower?: No Elevated toilet seat or a handicapped toilet?: No  TIMED UP AND GO:  Was the test performed?  no  Cognitive Function: 6CIT completed    02/15/2018    4:22 PM  MMSE - Mini Mental State Exam  Orientation to time 5  Orientation to Place 5  Registration 3  Attention/ Calculation 5  Recall 3  Language- name 2 objects 2  Language- repeat 1  Language- follow 3 step command 3  Language- read & follow direction 1  Write a sentence 1  Copy design 1  Total score 30        09/02/2023    2:41 PM 09/02/2022    2:55 PM 01/01/2022    3:30 PM 11/05/2020    9:13 AM  6CIT Screen  What Year? 0 points 0 points 0 points 0 points  What month? 0 points 0 points 0 points 0 points  What time? 0 points 0 points 0 points 0 points  Count back from 20 0 points 0 points 0 points  0 points  Months in reverse 0 points 0 points 0 points 0 points  Repeat phrase 0 points 0 points 0 points 2 points  Total Score 0 points 0 points 0 points 2 points    Immunizations Immunization History  Administered Date(s) Administered   Influenza,inj,Quad PF,6+ Mos 03/03/2020   Moderna Covid-19 Fall Seasonal Vaccine 62yrs & older 03/20/2023   Moderna Sars-Covid-2 Vaccination 06/12/2019, 07/11/2019, 03/21/2020   Td 09/25/2009    Screening Tests Health Maintenance  Topic Date Due   Zoster Vaccines- Shingrix (1 of 2) 09/30/2023 (Originally 05/12/1969)   DTaP/Tdap/Td (2 - Tdap) 06/29/2024 (Originally 09/26/2019)   Pneumonia Vaccine 87+ Years old (1 of 2 - PCV) 06/29/2024 (Originally 05/12/1969)   Hepatitis C Screening  06/29/2024 (Originally 05/12/1968)   COVID-19 Vaccine (5 - Moderna risk 2024-25 season) 09/17/2023   INFLUENZA VACCINE  11/19/2023   MAMMOGRAM  02/24/2024   Medicare Annual Wellness (AWV)  09/01/2024   DEXA SCAN  07/14/2025   Colonoscopy  11/13/2028   HPV VACCINES  Aged Out   Meningococcal B Vaccine  Aged Out    Health Maintenance  There are no preventive care reminders to display for this patient.  Health Maintenance Items Addressed: See Nurse Notes  Additional Screening:  Vision Screening: Recommended annual ophthalmology exams for early detection of glaucoma and other disorders of the eye.  Dental Screening: Recommended annual dental exams for proper oral hygiene  Community Resource Referral / Chronic Care Management: CRR required this visit?  No   CCM required this visit?  No   Plan:    I have personally reviewed and noted the following in the patient's chart:   Medical and social history Use of alcohol, tobacco or illicit drugs  Current medications and supplements including opioid prescriptions. Patient is not currently taking opioid prescriptions. Functional ability and status Nutritional status Physical activity Advanced directives List of  other physicians Hospitalizations, surgeries, and ER visits in previous 12 months Vitals Screenings to include cognitive, depression, and falls Referrals and appointments  In addition, I have reviewed and discussed with patient certain preventive protocols, quality metrics, and best practice recommendations. A written personalized care plan for preventive services as well as general preventive health recommendations were provided to patient.   Michaelle Adolphus, CMA   09/02/2023   After Visit Summary: (MyChart) Due to this being a telephonic visit, the after visit summary with patients personalized plan was offered to patient via MyChart   Notes: Nothing significant to report at this time.

## 2023-09-21 DIAGNOSIS — H40011 Open angle with borderline findings, low risk, right eye: Secondary | ICD-10-CM | POA: Diagnosis not present

## 2023-10-18 ENCOUNTER — Inpatient Hospital Stay: Attending: Oncology

## 2023-10-18 DIAGNOSIS — Z79899 Other long term (current) drug therapy: Secondary | ICD-10-CM | POA: Insufficient documentation

## 2023-10-18 DIAGNOSIS — D72819 Decreased white blood cell count, unspecified: Secondary | ICD-10-CM | POA: Diagnosis not present

## 2023-10-18 DIAGNOSIS — D709 Neutropenia, unspecified: Secondary | ICD-10-CM

## 2023-10-18 DIAGNOSIS — E538 Deficiency of other specified B group vitamins: Secondary | ICD-10-CM | POA: Insufficient documentation

## 2023-10-18 DIAGNOSIS — E611 Iron deficiency: Secondary | ICD-10-CM | POA: Insufficient documentation

## 2023-10-18 LAB — CBC WITH DIFFERENTIAL/PLATELET
Abs Immature Granulocytes: 0.01 10*3/uL (ref 0.00–0.07)
Basophils Absolute: 0 10*3/uL (ref 0.0–0.1)
Basophils Relative: 1 %
Eosinophils Absolute: 0.1 10*3/uL (ref 0.0–0.5)
Eosinophils Relative: 3 %
HCT: 40.1 % (ref 36.0–46.0)
Hemoglobin: 13.2 g/dL (ref 12.0–15.0)
Immature Granulocytes: 0 %
Lymphocytes Relative: 54 %
Lymphs Abs: 2.1 10*3/uL (ref 0.7–4.0)
MCH: 31.1 pg (ref 26.0–34.0)
MCHC: 32.9 g/dL (ref 30.0–36.0)
MCV: 94.6 fL (ref 80.0–100.0)
Monocytes Absolute: 0.3 10*3/uL (ref 0.1–1.0)
Monocytes Relative: 9 %
Neutro Abs: 1.2 10*3/uL — ABNORMAL LOW (ref 1.7–7.7)
Neutrophils Relative %: 33 %
Platelets: 284 10*3/uL (ref 150–400)
RBC: 4.24 MIL/uL (ref 3.87–5.11)
RDW: 12.6 % (ref 11.5–15.5)
WBC: 3.8 10*3/uL — ABNORMAL LOW (ref 4.0–10.5)
nRBC: 0 % (ref 0.0–0.2)

## 2023-10-18 LAB — IRON AND TIBC
Iron: 98 ug/dL (ref 28–170)
Saturation Ratios: 24 % (ref 10.4–31.8)
TIBC: 410 ug/dL (ref 250–450)
UIBC: 312 ug/dL

## 2023-10-18 LAB — VITAMIN B12: Vitamin B-12: 281 pg/mL (ref 180–914)

## 2023-10-18 LAB — FERRITIN: Ferritin: 14 ng/mL (ref 11–307)

## 2023-10-18 LAB — FOLATE: Folate: 15.1 ng/mL (ref >5.9)

## 2023-10-19 LAB — MISC LABCORP TEST (SEND OUT): Labcorp test code: 6025

## 2023-10-25 ENCOUNTER — Telehealth: Payer: Self-pay

## 2023-10-25 ENCOUNTER — Inpatient Hospital Stay: Attending: Oncology | Admitting: Oncology

## 2023-10-25 VITALS — BP 130/60 | HR 74 | Temp 98.1°F | Resp 18 | Ht 62.0 in | Wt 132.0 lb

## 2023-10-25 DIAGNOSIS — Z67A1 Duffy null: Secondary | ICD-10-CM | POA: Diagnosis not present

## 2023-10-25 DIAGNOSIS — E538 Deficiency of other specified B group vitamins: Secondary | ICD-10-CM | POA: Diagnosis not present

## 2023-10-25 DIAGNOSIS — Z79899 Other long term (current) drug therapy: Secondary | ICD-10-CM | POA: Insufficient documentation

## 2023-10-25 DIAGNOSIS — D72819 Decreased white blood cell count, unspecified: Secondary | ICD-10-CM | POA: Insufficient documentation

## 2023-10-25 LAB — SURGICAL PATHOLOGY

## 2023-10-25 NOTE — Progress Notes (Signed)
 [facility] at [facility]  HEMATOLOGY FOLLOW-UP VISIT  Kimberly Aguilar M, DO  REASON FOR FOLLOW-UP: Leukopenia  ASSESSMENT & PLAN:  Patient is a 73 y.o. 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   No B symptoms at this time. Mild Iron and Vitamin B12 deficiency WBC slightly improved from prior Red Blood Cell (RBC) Antigen Typing: Fya/Fyb: Negative   Explained to the patient that benign ethnic neutropenia is a harmless inherited condition, especially common in individuals of African or [place] descent, where people have consistently lower neutrophil counts (typically 1,000-1,500?cells/L) yet no increased risk of infection because their bone marrow still produces functioning cells.  No further hematological needs at this time.  Recommended patient to follow-up with primary care and return to us  in future with questions or concerns.  Vitamin B12 deficiency Mild vitamin B12 deficiency <400. Supplementation recommended.  - Start vitamin B12 supplementation 1000mcg daily.  - Repeat level can be checked with primary care   No orders of the defined types were placed in this encounter.   The total time spent in the appointment was 20 minutes encounter with patients including review of chart and various tests results, discussions about plan of care and coordination of care plan   All questions were answered. The patient knows to call the clinic with any problems, questions or concerns. No barriers to learning was detected.  Mickiel Dry, MD 7/7/20252:43 PM   SUMMARY OF HEMATOLOGIC HISTORY: Chronic leukopenia - Nutritional studies: WNL - Duffy Null Antigen: Negative   INTERVAL HISTORY: Kimberly Aguilar 73 y.o. female following for leukopenuia.  Patient has no complaints and is doing very well.  She denies fever, chills, weight loss, loss of appetite, night sweats.  I  have reviewed the past medical history, past surgical history, social history and family history with the patient   ALLERGIES:  has no known allergies.  MEDICATIONS:  Current Outpatient Medications  Medication Sig Dispense Refill   amLODipine  (NORVASC ) 2.5 MG tablet Take 1 tablet (2.5 mg total) by mouth every morning. 90 tablet 3   cholecalciferol (VITAMIN D3) 25 MCG (1000 UNIT) tablet Take 1,000 Units by mouth daily.     cyanocobalamin  (VITAMIN B12) 1000 MCG tablet Take 1 tablet (1,000 mcg total) by mouth daily. 90 tablet 0   ferrous sulfate  325 (65 FE) MG EC tablet Take 1 tablet (325 mg total) by mouth every other day. 45 tablet 3   fluticasone  furoate-vilanterol (BREO ELLIPTA ) 100-25 MCG/ACT AEPB Inhale 1 puff into the lungs daily. 180 each 3   No current facility-administered medications for this visit.     REVIEW OF SYSTEMS:   Constitutional: Denies fevers, chills or night sweats Eyes: Denies blurriness of vision Ears, nose, mouth, throat, and face: Denies mucositis or sore throat Respiratory: Denies cough, dyspnea or wheezes Cardiovascular: Denies palpitation, chest discomfort or lower extremity swelling Gastrointestinal:  Denies nausea, heartburn or change in bowel habits Skin: Denies abnormal skin rashes Lymphatics: Denies new lymphadenopathy or easy bruising Neurological:Denies numbness, tingling or new weaknesses Behavioral/Psych: Mood is stable, no new changes  All other systems were reviewed with the patient and are negative.  PHYSICAL EXAMINATION:   Vitals:   10/25/23 1353  BP: 130/60  Pulse: 74  Resp: 18  Temp: 98.1 F (36.7 C)  SpO2: 100%    GENERAL:alert, no distress and comfortable SKIN: skin color, texture, turgor are normal, no rashes or significant lesions LYMPH:  no  palpable lymphadenopathy in the cervical, axillary or inguinal LUNGS: clear to auscultation and percussion with normal breathing effort HEART: regular rate & rhythm and no murmurs and no  lower extremity edema ABDOMEN:abdomen soft, non-tender and normal bowel sounds Musculoskeletal:no cyanosis of digits and no clubbing  NEURO: alert & oriented x 3 with fluent speech  LABORATORY DATA:  I have reviewed the data as listed  Lab Results  Component Value Date   WBC 3.8 (L) 10/18/2023   NEUTROABS 1.2 (L) 10/18/2023   HGB 13.2 10/18/2023   HCT 40.1 10/18/2023   MCV 94.6 10/18/2023   PLT 284 10/18/2023      Chemistry      Component Value Date/Time   NA 139 07/12/2023 1318   NA 140 06/30/2023 1155   K 4.3 07/12/2023 1318   CL 103 07/12/2023 1318   CO2 27 07/12/2023 1318   BUN 17 07/12/2023 1318   BUN 16 06/30/2023 1155   CREATININE 1.01 (H) 07/12/2023 1318      Component Value Date/Time   CALCIUM  10.0 07/12/2023 1318   ALKPHOS 55 07/12/2023 1318   AST 18 07/12/2023 1318   ALT 14 07/12/2023 1318   BILITOT 0.4 07/12/2023 1318   BILITOT 0.4 06/30/2023 1155     Misc [facility] result COMMENT  Comment: (NOTE) Test Ordered: [id number] Fya/Fyb RBC Antigen Typing Fya                            Negative                  BN   Fyb                            Negative                  BN      Latest Reference Range & Units 10/18/23 13:49  Iron 28 - 170 ug/dL 98  UIBC ug/dL 687  TIBC 749 - 549 ug/dL 589  Saturation Ratios 10.4 - 31.8 % 24  Ferritin 11 - 307 ng/mL 14  Folate >5.9 ng/mL 15.1  Vitamin B12 180 - 914 pg/mL 281     RADIOGRAPHIC STUDIES: I have personally reviewed the radiological images as listed and agreed with the findings in the report.  None new to review

## 2023-10-25 NOTE — Telephone Encounter (Signed)
 Copied from CRM [id number]. Topic: Clinical - Lab/Test Results >> Oct 25, 2023  8:05 AM Alfonso ORN wrote: Reason for CRM: patient request if someone can call her back regarding a message in mychart believe regarding some lab work that was done on last Monday . Patient has an  appointment with her cancer doctor due to her iron low at 2:00pm and was wanting someone to call her before her appointment Pt call back [phone]   ----------------------------------------------------------------------- From previous Reason for Contact - Other: Reason for CRM:

## 2023-10-25 NOTE — Telephone Encounter (Signed)
 Informed pt that the blood work that was done last Monday was ordered by her oncologist but I do not see where a message was sent to her. Advised pt it could have been an appointment reminder since she seen the oncologist today. Pt verbalized understanding

## 2023-10-25 NOTE — Assessment & Plan Note (Signed)
 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   No B symptoms at this time. Mild Iron and Vitamin B12 deficiency WBC slightly improved from prior Red Blood Cell (RBC) Antigen Typing: Fya/Fyb: Negative   Explained to the patient that benign ethnic neutropenia is a harmless inherited condition, especially common in individuals of African or [place] descent, where people have consistently lower neutrophil counts (typically 1,000-1,500?cells/L) yet no increased risk of infection because their bone marrow still produces functioning cells.  No further hematological needs at this time.  Recommended patient to follow-up with primary care and return to us  in future with questions or concerns.

## 2023-10-25 NOTE — Patient Instructions (Signed)
 VISIT SUMMARY:  Today, we discussed your low white blood cell count and vitamin B12 levels. You have been diagnosed with benign ethnic neutropenia, which is common in individuals of African descent and does not cause complications. Additionally, your vitamin B12 levels are slightly low.  YOUR PLAN:  -BENIGN ETHNIC NEUTROPENIA: Benign ethnic neutropenia is a genetic condition more common in individuals of African descent, resulting in a low white blood cell count without causing complications. You have been discharged from the hematology clinic, and we discussed what this condition means and its implications.  -VITAMIN B12 DEFICIENCY: Vitamin B12 deficiency means that your body has lower than normal levels of vitamin B12, which is important for nerve function and the production of red blood cells. We recommend that you start taking over-the-counter vitamin B12 supplements.  INSTRUCTIONS:  Please start taking over-the-counter vitamin B12 supplements as recommended. You have been discharged from the hematology clinic, so no further follow-up is needed for your benign ethnic neutropenia at this time.

## 2023-10-25 NOTE — Assessment & Plan Note (Signed)
 Mild vitamin B12 deficiency <400. Supplementation recommended.  - Start vitamin B12 supplementation 1000mcg daily.  - Repeat level can be checked with primary care

## 2023-10-28 LAB — FLOW CYTOMETRY

## 2023-12-21 ENCOUNTER — Ambulatory Visit: Admitting: Family Medicine

## 2023-12-28 ENCOUNTER — Ambulatory Visit (INDEPENDENT_AMBULATORY_CARE_PROVIDER_SITE_OTHER): Admitting: Family Medicine

## 2023-12-28 ENCOUNTER — Ambulatory Visit (INDEPENDENT_AMBULATORY_CARE_PROVIDER_SITE_OTHER)

## 2023-12-28 ENCOUNTER — Encounter: Payer: Self-pay | Admitting: Family Medicine

## 2023-12-28 VITALS — BP 133/72 | HR 63 | Temp 97.6°F | Ht 62.0 in | Wt 130.0 lb

## 2023-12-28 DIAGNOSIS — M546 Pain in thoracic spine: Secondary | ICD-10-CM

## 2023-12-28 DIAGNOSIS — I129 Hypertensive chronic kidney disease with stage 1 through stage 4 chronic kidney disease, or unspecified chronic kidney disease: Secondary | ICD-10-CM | POA: Diagnosis not present

## 2023-12-28 DIAGNOSIS — R739 Hyperglycemia, unspecified: Secondary | ICD-10-CM

## 2023-12-28 DIAGNOSIS — M542 Cervicalgia: Secondary | ICD-10-CM

## 2023-12-28 DIAGNOSIS — R3915 Urgency of urination: Secondary | ICD-10-CM | POA: Diagnosis not present

## 2023-12-28 DIAGNOSIS — M47812 Spondylosis without myelopathy or radiculopathy, cervical region: Secondary | ICD-10-CM | POA: Diagnosis not present

## 2023-12-28 DIAGNOSIS — G8929 Other chronic pain: Secondary | ICD-10-CM

## 2023-12-28 DIAGNOSIS — M4802 Spinal stenosis, cervical region: Secondary | ICD-10-CM | POA: Diagnosis not present

## 2023-12-28 DIAGNOSIS — N1831 Hypertensive chronic kidney disease with stage 1 through stage 4 chronic kidney disease, or unspecified chronic kidney disease: Secondary | ICD-10-CM

## 2023-12-28 DIAGNOSIS — M50822 Other cervical disc disorders at C5-C6 level: Secondary | ICD-10-CM | POA: Diagnosis not present

## 2023-12-28 LAB — BAYER DCA HB A1C WAIVED: HB A1C (BAYER DCA - WAIVED): 6 % — ABNORMAL HIGH (ref 4.8–5.6)

## 2023-12-28 MED ORDER — LIDOCAINE 5 % EX PTCH: 1.0000 | MEDICATED_PATCH | Freq: Every day | CUTANEOUS | 99 refills | Status: AC | PRN

## 2023-12-28 NOTE — Progress Notes (Signed)
 Subjective: CC:CKD, HTN PCP: Jolinda Norene HERO, DO YEP:Kimberly Aguilar is a 73 y.o. female presenting to clinic today for:  Discussed the use of AI scribe software for clinical note transcription with the patient, who gave verbal consent to proceed.  History of Present Illness   Kimberly Aguilar is a 73 year old female who presents with neck pain and urinary urgency.  She experiences neck pain that has recently shifted to her shoulder area. The pain is less severe than it was previously, particularly when she first sought medical attention. She attributes some discomfort to stress and tension following the recent passing of her sister-in-law. Wearing certain earrings may have temporarily exacerbated the pain. She rarely uses Tylenol  for the discomfort, indicating that the pain is not as intense as it was in the past. She has a history of thyroid  surgery, which she initially thought might be related to her neck pain, but she is unsure. No weakness, numbness, or tingling in her arms associated with the neck pain.  She experiences urinary urgency intermittently, which she suspects might be related to lifting. The urgency is mild and off-and-on, without any associated bulges or sensations of incomplete bladder emptying. No nocturia or dysuria. She drinks water late at night, which sometimes causes her to wake up to urinate. Occasionally, she feels the need to urinate again shortly after voiding but does not need to lean forward to empty her bladder completely.  She uses Breo for her breathing and notes difficulty holding her breath as long as she used to when using the inhaler. She is unsure if she needs a higher dose but feels she is breathing well otherwise.  She experiences nausea when taking multivitamins on an empty stomach, which she mitigates by eating a cracker or two before taking them.       ROS: Per HPI  Not on File Past Medical History:  Diagnosis Date   Adenoma     PARATHYROID    Asthma    Colon polyp    Hypercalcemia    Hyperparathyroidism (HCC) 06/30/2013   Hypertension    Parathyroid  adenoma 2015    Current Outpatient Medications:    amLODipine  (NORVASC ) 2.5 MG tablet, Take 1 tablet (2.5 mg total) by mouth every morning., Disp: 90 tablet, Rfl: 3   cyanocobalamin  (VITAMIN B12) 1000 MCG tablet, Take 1 tablet (1,000 mcg total) by mouth daily., Disp: 90 tablet, Rfl: 0   fluticasone  furoate-vilanterol (BREO ELLIPTA ) 100-25 MCG/ACT AEPB, Inhale 1 puff into the lungs daily., Disp: 180 each, Rfl: 3   lidocaine  (LIDODERM ) 5 %, Place 1 patch onto the skin daily as needed (upper back/ neck pain). Remove & Discard patch within 12 hours or as directed by MD, Disp: 30 patch, Rfl: PRN   cholecalciferol (VITAMIN D3) 25 MCG (1000 UNIT) tablet, Take 1,000 Units by mouth daily. (Patient not taking: Reported on 12/28/2023), Disp: , Rfl:    ferrous sulfate  325 (65 FE) MG EC tablet, Take 1 tablet (325 mg total) by mouth every other day. (Patient not taking: Reported on 12/28/2023), Disp: 45 tablet, Rfl: 3 Social History   Socioeconomic History   Marital status: Widowed    Spouse name: Not on file   Number of children: 2   Years of education: Not on file   Highest education level: Not on file  Occupational History   Not on file  Tobacco Use   Smoking status: Former    Current packs/day: 0.00    Types: Cigarettes  Quit date: 03/31/1973    Years since quitting: 50.7   Smokeless tobacco: Never  Vaping Use   Vaping status: Never Used  Substance and Sexual Activity   Alcohol use: No   Drug use: No   Sexual activity: Not on file  Other Topics Concern   Not on file  Social History Narrative   Ms Kimberly Aguilar has a daughter who resides in [place]   She had a son, who was disabled after he sustained TBI 2/2 meningitis, but he passed away 06-08-2017.   Social Drivers of Corporate investment banker Strain: Low Risk  (09/02/2023)   Overall Financial Resource Strain  (CARDIA)    Difficulty of Paying Living Expenses: Not hard at all  Food Insecurity: No Food Insecurity (09/02/2023)   Hunger Vital Sign    Worried About Running Out of Food in the Last Year: Never true    Ran Out of Food in the Last Year: Never true  Transportation Needs: No Transportation Needs (09/02/2023)   PRAPARE - Administrator, Civil Service (Medical): No    Lack of Transportation (Non-Medical): No  Physical Activity: Insufficiently Active (09/02/2023)   Exercise Vital Sign    Days of Exercise per Week: 3 days    Minutes of Exercise per Session: 30 min  Stress: No Stress Concern Present (09/02/2023)   [employer] of Occupational Health - Occupational Stress Questionnaire    Feeling of Stress : Not at all  Social Connections: Moderately Isolated (09/02/2023)   Social Connection and Isolation Panel    Frequency of Communication with Friends and Family: More than three times a week    Frequency of Social Gatherings with Friends and Family: More than three times a week    Attends Religious Services: Never    Database administrator or Organizations: Yes    Attends Banker Meetings: Never    Marital Status: Widowed  Intimate Partner Violence: Not At Risk (09/02/2023)   Humiliation, Afraid, Rape, and Kick questionnaire    Fear of Current or Ex-Partner: No    Emotionally Abused: No    Physically Abused: No    Sexually Abused: No   Family History  Problem Relation Age of Onset   Hypertension Mother    Heart disease Mother    Hypertension Father    Diabetes Sister    Cancer Sister 61       Uterine Cancer had hysterectomy   Congestive Heart Failure Brother    Heart disease Brother    Heart disease Brother    Diabetes Brother    Seizures Brother    Hypertension Brother    Hyperlipidemia Brother    Seizures Son    Colon cancer Neg Hx    Colon polyps Neg Hx    Esophageal cancer Neg Hx    Stomach cancer Neg Hx    Rectal cancer Neg Hx      Objective: Office vital signs reviewed. BP 133/72   Pulse 63   Temp 97.6 F (36.4 C)   Ht 5' 2 (1.575 m)   Wt 130 lb (59 kg)   SpO2 99%   BMI 23.78 kg/m   Physical Examination:  General: Awake, alert, well nourished, No acute distress HEENT: sclera white, MMM Cardio: regular rate and rhythm, S1S2 heard, no murmurs appreciated Pulm: clear to auscultation bilaterally, no wheezes, rhonchi or rales; normal work of breathing on room air MSK:   Cervical spine: has FROM. No midline TTP. She has  increased tonicity of trapezius muscle bilaterally.  Assessment/ Plan: 73 y.o. 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 No significant neurological symptoms. Pain less severe than before. Tylenol  effective. - Order cervical spine X-ray to assess for arthritis and spondylosis. - Prescribe lidocaine  patches for localized pain relief. - Advise use of Tylenol  for pain management as needed. - Encourage gentle neck stretching exercises.  Urinary urgency Intermittent urinary urgency without significant incontinence or nocturia. No bladder prolapse or infection. Symptoms mild, no urological evaluation needed currently. - Monitor symptoms and consider urology referral if urgency becomes more frequent or severe.  Hypertensive chronic kidney disease Chronic kidney disease likely secondary to hypertension. - Check kidney function through blood  work.  Hyperglycemia Hyperglycemia monitored. Discussed importance of dietary management and physical activity for blood sugar control. - Check blood sugar levels. - Encourage dietary management and regular physical activity.          Norene CHRISTELLA Fielding, DO [facility] [phone]

## 2023-12-29 ENCOUNTER — Ambulatory Visit: Payer: Self-pay | Admitting: Family Medicine

## 2023-12-29 LAB — RENAL FUNCTION PANEL
Albumin: 4.2 g/dL (ref 3.8–4.8)
BUN/Creatinine Ratio: 12 (ref 12–28)
BUN: 12 mg/dL (ref 8–27)
CO2: 25 mmol/L (ref 20–29)
Calcium: 9.6 mg/dL (ref 8.7–10.3)
Chloride: 104 mmol/L (ref 96–106)
Creatinine, Ser: 1.02 mg/dL — ABNORMAL HIGH (ref 0.57–1.00)
Glucose: 93 mg/dL (ref 70–99)
Phosphorus: 3 mg/dL (ref 3.0–4.3)
Potassium: 4.3 mmol/L (ref 3.5–5.2)
Sodium: 140 mmol/L (ref 134–144)
eGFR: 58 mL/min/1.73 — ABNORMAL LOW (ref >59)

## 2024-01-10 ENCOUNTER — Telehealth: Payer: Self-pay | Admitting: Family Medicine

## 2024-01-10 NOTE — Telephone Encounter (Unsigned)
 Copied from CRM [id number]. Topic: Clinical - Medical Advice >> Jan 10, 2024  3:28 PM Lauren C wrote: Reason for CRM: Pt was told she had pre-diabetes and is wanting more information about what causes diabetes or worsens it. She enjoys sweets like [employer] and wants to know if those could have caused it or if she needs to stay away from things like that. She was told to stay away from sweets, but she is wanting more information to stay proactive about her health and get out of the pre-diabetes range. Requesting a CB # [phone]

## 2024-01-11 NOTE — Telephone Encounter (Signed)
 Patient prefers printouts.  She will come by and pick them up tomorrow.

## 2024-01-11 NOTE — Telephone Encounter (Signed)
 Correct sweets can increase diabetes risk, bread/ pasta/ rice/ potatoes/ starches including sweetened cereals/ sugary beverages including sweet tea/ juices.  If she wants a referral to a dietician, let me know and I'm glad to place a referral. Can also put a sheet up front with dietary recommendations if she wants that.

## 2024-01-18 ENCOUNTER — Other Ambulatory Visit (HOSPITAL_COMMUNITY): Payer: Self-pay | Admitting: Family Medicine

## 2024-01-18 DIAGNOSIS — Z1231 Encounter for screening mammogram for malignant neoplasm of breast: Secondary | ICD-10-CM

## 2024-02-28 ENCOUNTER — Ambulatory Visit (HOSPITAL_COMMUNITY)
Admission: RE | Admit: 2024-02-28 | Discharge: 2024-02-28 | Disposition: A | Discharge status: Home / Self Care | Source: Ambulatory Visit | Attending: Family Medicine | Admitting: Family Medicine

## 2024-02-28 DIAGNOSIS — Z1231 Encounter for screening mammogram for malignant neoplasm of breast: Secondary | ICD-10-CM | POA: Diagnosis not present

## 2024-04-06 DIAGNOSIS — H35371 Puckering of macula, right eye: Secondary | ICD-10-CM | POA: Diagnosis not present

## 2024-04-06 DIAGNOSIS — H524 Presbyopia: Secondary | ICD-10-CM | POA: Diagnosis not present

## 2024-04-06 LAB — OPHTHALMOLOGY REPORT-SCANNED

## 2024-05-09 ENCOUNTER — Other Ambulatory Visit: Payer: Self-pay | Admitting: Family Medicine

## 2024-05-09 DIAGNOSIS — I129 Hypertensive chronic kidney disease with stage 1 through stage 4 chronic kidney disease, or unspecified chronic kidney disease: Secondary | ICD-10-CM

## 2024-06-26 ENCOUNTER — Ambulatory Visit: Payer: Self-pay | Admitting: Family Medicine
